# Patient Record
Sex: Female | Born: 1975 | Race: White | Hispanic: No | Marital: Married | Smoking: Never smoker
Health system: Southern US, Community
[De-identification: ages and names within clinical notes are randomized; demographics above are authoritative.]

## PROBLEM LIST (undated history)

## (undated) DIAGNOSIS — E1165 Type 2 diabetes mellitus with hyperglycemia: Secondary | ICD-10-CM

## (undated) DIAGNOSIS — K635 Polyp of colon: Secondary | ICD-10-CM

## (undated) DIAGNOSIS — Z87442 Personal history of urinary calculi: Secondary | ICD-10-CM

## (undated) DIAGNOSIS — K219 Gastro-esophageal reflux disease without esophagitis: Secondary | ICD-10-CM

## (undated) DIAGNOSIS — K449 Diaphragmatic hernia without obstruction or gangrene: Secondary | ICD-10-CM

## (undated) DIAGNOSIS — G932 Benign intracranial hypertension: Secondary | ICD-10-CM

## (undated) DIAGNOSIS — K5790 Diverticulosis of intestine, part unspecified, without perforation or abscess without bleeding: Secondary | ICD-10-CM

## (undated) DIAGNOSIS — K760 Fatty (change of) liver, not elsewhere classified: Secondary | ICD-10-CM

## (undated) DIAGNOSIS — E7800 Pure hypercholesterolemia, unspecified: Secondary | ICD-10-CM

## (undated) DIAGNOSIS — R519 Headache, unspecified: Secondary | ICD-10-CM

## (undated) HISTORY — DX: Diaphragmatic hernia without obstruction or gangrene: K44.9

## (undated) HISTORY — DX: Diverticulosis of intestine, part unspecified, without perforation or abscess without bleeding: K57.90

## (undated) HISTORY — DX: Type 2 diabetes mellitus with hyperglycemia: E11.65

## (undated) HISTORY — PX: COLONOSCOPY WITH ESOPHAGOGASTRODUODENOSCOPY (EGD): SHX5779

## (undated) HISTORY — DX: Polyp of colon: K63.5

## (undated) HISTORY — DX: Pure hypercholesterolemia, unspecified: E78.00

## (undated) HISTORY — PX: COLONOSCOPY: SHX174

## (undated) HISTORY — PX: TONSILLECTOMY AND ADENOIDECTOMY: SHX28

## (undated) HISTORY — DX: Fatty (change of) liver, not elsewhere classified: K76.0

## (undated) HISTORY — DX: Gastro-esophageal reflux disease without esophagitis: K21.9

## (undated) HISTORY — PX: TOTAL ABDOMINAL HYSTERECTOMY: SHX209

## (undated) HISTORY — PX: CYST REMOVAL HAND: SHX6279

## (undated) HISTORY — PX: UPPER GI ENDOSCOPY: SHX6162

## (undated) HISTORY — PX: LITHOTRIPSY: SUR834

## (undated) HISTORY — DX: Benign intracranial hypertension: G93.2

---

## 1999-05-02 DIAGNOSIS — E119 Type 2 diabetes mellitus without complications: Secondary | ICD-10-CM

## 1999-05-02 HISTORY — DX: Type 2 diabetes mellitus without complications: E11.9

## 2014-01-10 DIAGNOSIS — G932 Benign intracranial hypertension: Secondary | ICD-10-CM | POA: Insufficient documentation

## 2016-04-08 DIAGNOSIS — E559 Vitamin D deficiency, unspecified: Secondary | ICD-10-CM

## 2016-04-08 HISTORY — DX: Vitamin D deficiency, unspecified: E55.9

## 2016-04-28 DIAGNOSIS — N200 Calculus of kidney: Secondary | ICD-10-CM | POA: Insufficient documentation

## 2016-04-28 DIAGNOSIS — E559 Vitamin D deficiency, unspecified: Secondary | ICD-10-CM | POA: Insufficient documentation

## 2016-04-28 HISTORY — DX: Calculus of kidney: N20.0

## 2016-11-09 DIAGNOSIS — E785 Hyperlipidemia, unspecified: Secondary | ICD-10-CM | POA: Insufficient documentation

## 2016-11-09 HISTORY — DX: Hyperlipidemia, unspecified: E78.5

## 2017-10-17 HISTORY — DX: Morbid (severe) obesity due to excess calories: E66.01

## 2018-02-13 DIAGNOSIS — M6700 Short Achilles tendon (acquired), unspecified ankle: Secondary | ICD-10-CM | POA: Insufficient documentation

## 2018-02-13 DIAGNOSIS — M722 Plantar fascial fibromatosis: Secondary | ICD-10-CM | POA: Insufficient documentation

## 2018-10-13 ENCOUNTER — Encounter: Payer: Self-pay | Admitting: Nurse Practitioner

## 2019-04-19 DIAGNOSIS — I10 Essential (primary) hypertension: Secondary | ICD-10-CM

## 2019-04-19 HISTORY — DX: Essential (primary) hypertension: I10

## 2019-05-02 DIAGNOSIS — E1165 Type 2 diabetes mellitus with hyperglycemia: Secondary | ICD-10-CM | POA: Insufficient documentation

## 2019-05-02 DIAGNOSIS — E119 Type 2 diabetes mellitus without complications: Secondary | ICD-10-CM | POA: Insufficient documentation

## 2019-11-06 ENCOUNTER — Encounter: Payer: Self-pay | Admitting: Nurse Practitioner

## 2019-12-12 ENCOUNTER — Encounter: Payer: Self-pay | Admitting: Nurse Practitioner

## 2019-12-12 DIAGNOSIS — K760 Fatty (change of) liver, not elsewhere classified: Secondary | ICD-10-CM | POA: Insufficient documentation

## 2020-02-18 ENCOUNTER — Encounter: Payer: Self-pay | Admitting: Nurse Practitioner

## 2020-02-19 ENCOUNTER — Encounter: Payer: Self-pay | Admitting: Nurse Practitioner

## 2020-02-29 ENCOUNTER — Ambulatory Visit (INDEPENDENT_AMBULATORY_CARE_PROVIDER_SITE_OTHER): Payer: Managed Care, Other (non HMO) | Admitting: Nurse Practitioner

## 2020-02-29 ENCOUNTER — Telehealth: Payer: Self-pay | Admitting: Nurse Practitioner

## 2020-02-29 ENCOUNTER — Encounter: Payer: Self-pay | Admitting: Nurse Practitioner

## 2020-02-29 VITALS — BP 106/84 | HR 104 | Temp 97.7°F | Ht 64.25 in | Wt 230.4 lb

## 2020-02-29 DIAGNOSIS — K3184 Gastroparesis: Secondary | ICD-10-CM | POA: Diagnosis not present

## 2020-02-29 DIAGNOSIS — R6889 Other general symptoms and signs: Secondary | ICD-10-CM | POA: Diagnosis not present

## 2020-02-29 DIAGNOSIS — K219 Gastro-esophageal reflux disease without esophagitis: Secondary | ICD-10-CM | POA: Diagnosis not present

## 2020-02-29 DIAGNOSIS — R1013 Epigastric pain: Secondary | ICD-10-CM

## 2020-02-29 DIAGNOSIS — R12 Heartburn: Secondary | ICD-10-CM

## 2020-02-29 DIAGNOSIS — R0989 Other specified symptoms and signs involving the circulatory and respiratory systems: Secondary | ICD-10-CM

## 2020-02-29 NOTE — Progress Notes (Addendum)
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  S/P total abdominal hysterectomy, D & C and C section x 2. She presents today for further evaluation for constant throat clearing, feels like a frog in throat for the past month and epigastric discomfort for the past 6 months. She feels as if food just sits in the top of her stomach. No difficulty swallowing, food does not get stuck in her esophagus. She has occasional heartburn for which she takes TUMS, if not relieved she takes Omeprazole 41m which resolves her symptoms. She feels full easily. No nausea or vomiting. She developed left chest pain 2 weeks ago which occurs randomly unassociated to eating or activity. She is scheduled to see a cardiologist on 03/03/2020 for further evaluation. No chest pain at this time. No SOB. She is passing  A normal brown BM daily or every other day. She occasionally passes a loose stool. No rectal bleeding or black stool. No fever, sweats or chills. She [place]. She stated the EGD showed evidence of gastroparesis and the colonoscopy was normal. I will request a copy of her EGD and colonoscopy reports for further review. No family history of esophageal, gastric or colon cancer.   Abdominal sonogram 12/12/2019:  Hepatic steatosis, non obstructing renal calculi bilaterally.    Past Medical History:  Diagnosis Date  . Benign essential hypertension 04/19/2019   [facility] 02/18/2020  . GERD (gastroesophageal reflux disease)    [facility] [place]  . Hypergly[place] due to type 2 diabetes mellitus ([place]    [facility] [place]  . Hyperlipidemia 11/09/2016   [facility] 02/[facility]  . Kidney stone 04/28/2016   [facility] 02/18/2020  . Morbid obesity ([place] 10/17/2017   [facility]  . Pseudotumor cerebri    [facility] [place]  . Steatosis of liver    [facility] 02/18/2020  . Type 2 diabetes mellitus ([place] 05[place]02/[place]   from encounter [facility] 02/18/2020  . Vitamin D deficiency 04/08/2016   [facility] encounter[facility]/19/2021   Past Surgical History:  [place]ality Date  . [facility] [place]  [place] [facility] [place]  . TOTAL ABDOMINAL HYSTERECTOMY     [facility] 02/18/2020    Social History: Married. She has one son and one daughter. Nonsmoker. No alcohol use. No drug use.   Family History:  Father age 7868Parkinson's disease, HTN an heart disease.  Mother 680age thyroid issues, DM and heart disease.   Allergies  Allergen Reactions  . Sulfa Antibiotics Hives     Outpatient Encounter Medications as of 02/29/2020  Medication Sig  . amLODipine (NORVASC) 10 MG tablet Take 10 mg by mouth daily.  .Marland Kitchenatorvastatin (LIPITOR) 20 MG tablet Take 20 mg by mouth daily.  . hydrochlorothiazide (MICROZIDE) 12.5 MG capsule Take 12.5 mg by mouth daily.  .Marland Kitchenlisinopril (ZESTRIL) 40 MG tablet Take 40 mg by mouth daily.  . metFORMIN (GLUCOPHAGE) 500 MG tablet Take  500 mg by mouth daily.  Marland Kitchen omeprazole (PRILOSEC) 20 MG capsule Take 20 mg by mouth daily.   No facility-administered encounter medications on file as of 02/29/2020.    REVIEW OF SYSTEMS: All other systems reviewed and negative except where noted in the History of Present Illness.  PHYSICAL EXAM: BP 106/84   Pulse (!) 104    Temp 97.7 F (36.5 C)   Ht 5' 4.25" (1.632 m)   Wt 230 lb 6 oz (104.5 kg)   BMI 39.24 kg/m  General: Well developed  44 year old female in no acute distress. Head: Normocephalic and atraumatic. Eyes:  Sclerae non-icteric, conjunctive pink. Ears: Normal auditory acuity. Mouth: Dentition intact. No ulcers or lesions.  Neck: Supple, no lymphadenopathy or thyromegaly.  Lungs: Clear bilaterally to auscultation without wheezes, crackles or rhonchi. Heart: Regular rate and rhythm. No murmur, rub or gallop appreciated.  Abdomen: Soft, nontender, non distended. No masses. No hepatosplenomegaly. Normoactive bowel sounds x 4 quadrants.  Rectal: Deferred.  Musculoskeletal: Symmetrical with no gross deformities. Skin: Warm and dry. No rash or lesions on visible extremities. Extremities: No edema. Neurological: Alert oriented x 4, no focal deficits.  Psychological:  Alert and cooperative. Normal mood and affect.  ASSESSMENT AND PLAN:  32. 44 year old female with frequent throat clearing, heartburn and epigastric discomfort. Questionable history of gastroparesis. EGD benefits and risks discussed including risk with sedation, risk of bleeding, perforation and infection  -Patient will call our office to schedule EGD after cardiac evaluation completed  -Omeprazole 12m once daily -Small  snack size meals 3 to 4 times daily -If EGD is negative, consider gastric empty study   2. Colon cancer screening (Patietn will turn 44 next month)  -Consider colonoscopy at the time of EGD, to discuss further after patient completes cardiac evaluation   3. DM II   4. Hepatic steatosis  -Request copy of most recent CMP and CBC from PCP's office   5. Left chest pain -Proceed with cardiac evaluation   Addendum 03/12/2020: Laboratory studies received from PCP dated 02/18/2020: Alk phos 169.  AST 23.  ALT 23.  Total bili 0.6.  Direct bili< 0.2. Glucose 104.  BUN 12.  Creatinine 0.76.  Sodium 139.  Potassium 4.2.  A  CBC was not done.    Patient will be contacted to have repeat laboratory studies done to include alk phos isoenzymes, GGT, ANA, SMA and AMA and CBC.     CC:  Earp, JPricilla Holm FNP

## 2020-02-29 NOTE — Patient Instructions (Signed)
If you are age 45 or older, your body mass index should be between 23-30. Your Body mass index is 39.24 kg/m. If this is out of the aforementioned range listed, please consider follow up with your Primary Care Provider.  If you are age 83 or younger, your body mass index should be between 19-25. Your Body mass index is 39.24 kg/m. If this is out of the aformentioned range listed, please consider follow up with your Primary Care Provider.   Take omeprazole 20mg  daily Call our office when you have received Cardiac clearance so we may schedule you and Endoscopy.  Due to recent changes in healthcare laws, you may see the results of your imaging and laboratory studies on MyChart before your provider has had a chance to review them.  We understand that in some cases there may be results that are confusing or concerning to you. Not all laboratory results come back in the same time frame and the provider may be waiting for multiple results in order to interpret others.  Please give [place] 48 hours in order for your provider to thoroughly review all the results before contacting the office for clarification of your results.   Thank you for choosing [facility] Noralyn Pick, CRNP

## 2020-02-29 NOTE — Telephone Encounter (Signed)
Tracy, pls contact PCP for copy of most recent CBC and CMP thx

## 2020-03-01 NOTE — Progress Notes (Signed)
Attending Physician's Attestation   I have reviewed the chart.   I agree with the Advanced Practitioner's note, impression, and recommendations with any updates as below.    Nazario Russom Mansouraty, MD [facility] Advanced Endoscopy Office # [phone]  

## 2020-03-12 ENCOUNTER — Telehealth: Payer: Self-pay | Admitting: Nurse Practitioner

## 2020-03-12 DIAGNOSIS — K760 Fatty (change of) liver, not elsewhere classified: Secondary | ICD-10-CM

## 2020-03-12 DIAGNOSIS — R1013 Epigastric pain: Secondary | ICD-10-CM

## 2020-03-12 DIAGNOSIS — R748 Abnormal levels of other serum enzymes: Secondary | ICD-10-CM

## 2020-03-12 NOTE — Telephone Encounter (Signed)
Spoke with the patient she is aware of her elevated alk phosphatase level. The patient was informed we need her to have additional labs, patient agreed to come to the [facility] lab on 03/13/2020. She stated she saw the cardiologist and is scheduled for a stress test on 03/25/2020.

## 2020-03-12 NOTE — Telephone Encounter (Signed)
Olivia Mackie please inform the patient I received a copy of her laboratory studies dated 02/18/2020 by her PCPs office.  Her alk phosphatase level is elevated therefore I have ordered additional labs to follow-up on this.  A CBC was not done therefore I have ordered a CBC as well.  Please enter orders for the following labs:alk phos isoenzymes, GGT, ANA, SMA and AMA and CBC. DX: elevated alk phos, hepatic steatosis and epigastric pain. Please verify with patient if she has completed her cardiac evaluation. Refer to office visit  4/30. Thx.

## 2020-03-13 ENCOUNTER — Other Ambulatory Visit (INDEPENDENT_AMBULATORY_CARE_PROVIDER_SITE_OTHER): Payer: Managed Care, Other (non HMO)

## 2020-03-13 DIAGNOSIS — R1013 Epigastric pain: Secondary | ICD-10-CM | POA: Diagnosis not present

## 2020-03-13 DIAGNOSIS — R748 Abnormal levels of other serum enzymes: Secondary | ICD-10-CM | POA: Diagnosis not present

## 2020-03-13 DIAGNOSIS — K760 Fatty (change of) liver, not elsewhere classified: Secondary | ICD-10-CM | POA: Diagnosis not present

## 2020-03-13 LAB — CBC
HCT: 44.4 % (ref 36.0–46.0)
Hemoglobin: 15.7 g/dL — ABNORMAL HIGH (ref 12.0–15.0)
MCHC: 35.3 g/dL (ref 30.0–36.0)
MCV: 90.2 fl (ref 78.0–100.0)
Platelets: 243 10*3/uL (ref 150.0–400.0)
RBC: 4.93 Mil/uL (ref 3.87–5.11)
RDW: 12.7 % (ref 11.5–15.5)
WBC: 7.7 10*3/uL (ref 4.0–10.5)

## 2020-03-17 LAB — ANA: Anti Nuclear Antibody (ANA): NEGATIVE

## 2020-03-17 LAB — MITOCHONDRIAL ANTIBODIES: Mitochondrial M2 Ab, IgG: <=20 U

## 2020-03-17 LAB — ANTI-SMOOTH MUSCLE ANTIBODY, IGG: Actin (Smooth Muscle) Antibody (IGG): <20 U (ref <20)

## 2020-03-20 LAB — ALKALINE PHOSPHATASE, ISOENZYMES
Alkaline Phosphatase: 166 IU/L — ABNORMAL HIGH (ref 39–117)
BONE FRACTION: 31 % (ref 14–68)
INTESTINAL FRAC.: 14 % (ref 0–18)
LIVER FRACTION: 55 % (ref 18–85)

## 2020-03-25 ENCOUNTER — Telehealth: Payer: Self-pay | Admitting: General Surgery

## 2020-03-25 ENCOUNTER — Other Ambulatory Visit (INDEPENDENT_AMBULATORY_CARE_PROVIDER_SITE_OTHER): Payer: Managed Care, Other (non HMO)

## 2020-03-25 DIAGNOSIS — D582 Other hemoglobinopathies: Secondary | ICD-10-CM

## 2020-03-25 DIAGNOSIS — R748 Abnormal levels of other serum enzymes: Secondary | ICD-10-CM

## 2020-03-25 LAB — IBC + FERRITIN
Ferritin: 76.7 ng/mL (ref 10.0–291.0)
Iron: 109 ug/dL (ref 42–145)
Saturation Ratios: 30.7 % (ref 20.0–50.0)
Transferrin: 254 mg/dL (ref 212.0–360.0)

## 2020-03-25 LAB — GAMMA GT: GGT: 27 U/L (ref 7–51)

## 2020-03-25 NOTE — Telephone Encounter (Signed)
Notified the patient of her results and requested that she comes to the clinic for more labs. The patient stated she will be in [place] today for her stress test and will come to the [place] lab this afternoon.

## 2020-03-25 NOTE — Telephone Encounter (Signed)
-----  Message from Colleen M Kennedy-Smith, NP sent at 03/25/2020  7:05 AM EDT ----- Tracy, can you contact the patient. Her alk phos level remains elevated. Alk phos isoenzymes normal. I requested a GGT level but was not done at the time of this lab collections. Pls send the patient to the lab to have a GGT level done.   Her Hg level is elevated. Please enter iron level and ferritin level to the lab order.  Diagnosis codes: Elevated alk phos and Elevated Hg level for lab order. Thx  

## 2020-03-27 NOTE — Telephone Encounter (Signed)
-----  Message from Noralyn Pick, NP sent at 03/25/2020  7:05 AM EDT ----- Olivia Mackie, can you contact the patient. Her alk phos level remains elevated. Alk phos isoenzymes normal. I requested a GGT level but was not done at the time of this lab collections. Pls send the patient to the lab to have a GGT level done.   Her Hg level is elevated. Please enter iron level and ferritin level to the lab order.  Diagnosis codes: Elevated alk phos and Elevated Hg level for lab order. Thx

## 2020-04-02 ENCOUNTER — Telehealth: Payer: Self-pay | Admitting: Nurse Practitioner

## 2020-04-03 NOTE — Telephone Encounter (Signed)
Pt has been informed to resend as it has not been located.

## 2020-04-04 NOTE — Telephone Encounter (Signed)
Records have been received and given to NP [facility] for review.

## 2020-04-10 ENCOUNTER — Telehealth: Payer: Self-pay

## 2020-04-10 NOTE — Telephone Encounter (Signed)
Patient is advised of her lab results and the recommendations. She is inquiring about EGD and colonoscopy.

## 2020-04-10 NOTE — Telephone Encounter (Signed)
-----  Message from Noralyn Pick, NP sent at 04/07/2020  3:04 PM EDT ----- Eustaquio Maize, can you contact the patient and schedule her for an abd sonogram complete DX: fatty liver and elevated Alk phos level.  Pls provide her with a lab order to repeat a hepatic panel in 4 to 8 weeks.  Thx.

## 2020-04-14 ENCOUNTER — Other Ambulatory Visit: Payer: Self-pay

## 2020-04-14 NOTE — Telephone Encounter (Signed)
Ann Porter, I have reviewed her NM cardiac stress test completed on 03/25/2020 which was normal. ECHO with LV EF 55-60%. Ok to schedule an EGD and a screening colonoscopy. Let me know if the patient has any questions and if she wishes to discuss further. Thx

## 2020-04-14 NOTE — Telephone Encounter (Signed)
Ann Porter The patient was not as interested in the need for abd u/s and repeat labs. She states "I am waiting to hear about the endoscopy and colonoscopy." Did the cardiac information come to you? Can I schedule her?

## 2020-04-15 DIAGNOSIS — H05119 Granuloma of unspecified orbit: Secondary | ICD-10-CM | POA: Insufficient documentation

## 2020-04-15 DIAGNOSIS — K219 Gastro-esophageal reflux disease without esophagitis: Secondary | ICD-10-CM | POA: Insufficient documentation

## 2020-04-15 NOTE — Telephone Encounter (Signed)
Spoke with the patient. Agreed to her Pre-Visit tomorrow. EGD for epigastric pain and a screening colonoscopy on 04/28/20 at 8:00am.

## 2020-04-16 ENCOUNTER — Other Ambulatory Visit: Payer: Self-pay

## 2020-04-16 ENCOUNTER — Ambulatory Visit (AMBULATORY_SURGERY_CENTER): Payer: Self-pay | Admitting: *Deleted

## 2020-04-16 VITALS — Ht 64.25 in | Wt 230.0 lb

## 2020-04-16 DIAGNOSIS — Z1211 Encounter for screening for malignant neoplasm of colon: Secondary | ICD-10-CM

## 2020-04-16 DIAGNOSIS — R1013 Epigastric pain: Secondary | ICD-10-CM

## 2020-04-16 DIAGNOSIS — Z01818 Encounter for other preprocedural examination: Secondary | ICD-10-CM

## 2020-04-16 NOTE — Consult Note (Signed)
Patient is here in-person for PV. Patient denies any allergies to eggs or soy. Patient denies any problems with anesthesia/sedation. Patient denies any oxygen use at home. Patient denies taking any diet/weight loss medications or blood thinners. Patient is not being treated for MRSA or C-diff. Patient is aware of our care-partner policy and PNDLO-31 safety protocol. EMMI education assisgned to the patient for the procedure, this was explained and instructions given to patient. Patient denies any changes to medical hx since last GI OV.  COVID-19 screening test is on 6/23 at 1030 am, the pt is aware.

## 2020-04-23 ENCOUNTER — Ambulatory Visit (INDEPENDENT_AMBULATORY_CARE_PROVIDER_SITE_OTHER): Payer: Managed Care, Other (non HMO)

## 2020-04-23 ENCOUNTER — Other Ambulatory Visit: Payer: Self-pay | Admitting: Gastroenterology

## 2020-04-23 DIAGNOSIS — Z1159 Encounter for screening for other viral diseases: Secondary | ICD-10-CM

## 2020-04-24 LAB — SARS CORONAVIRUS 2 (TAT 6-24 HRS): SARS Coronavirus 2: NEGATIVE

## 2020-04-25 ENCOUNTER — Encounter: Payer: Self-pay | Admitting: Gastroenterology

## 2020-04-25 ENCOUNTER — Telehealth: Payer: Self-pay | Admitting: Nurse Practitioner

## 2020-04-25 NOTE — Telephone Encounter (Signed)
Added to Dr Vena Rua Monday schedule on 04-28-2020 at 10:30am.

## 2020-04-25 NOTE — Telephone Encounter (Signed)
OPENED IN ERROR

## 2020-04-25 NOTE — Telephone Encounter (Signed)
Noted thanks °

## 2020-04-25 NOTE — Telephone Encounter (Signed)
Patient was called this morning to r/s procedure with Dr. Rush Landmark on monday. there is no more appointments to schedule with Dr. Rush Landmark because it is a double procedure and he has nothing avaiable. and she is not happy. She is asking to speak with nurse to see what she can do.

## 2020-04-28 ENCOUNTER — Ambulatory Visit (AMBULATORY_SURGERY_CENTER): Payer: Managed Care, Other (non HMO) | Admitting: Internal Medicine

## 2020-04-28 ENCOUNTER — Encounter: Payer: Managed Care, Other (non HMO) | Admitting: Gastroenterology

## 2020-04-28 ENCOUNTER — Other Ambulatory Visit: Payer: Self-pay

## 2020-04-28 ENCOUNTER — Encounter: Payer: Self-pay | Admitting: Internal Medicine

## 2020-04-28 VITALS — BP 126/80 | HR 79 | Temp 97.3°F | Resp 15 | Ht 64.25 in | Wt 230.0 lb

## 2020-04-28 DIAGNOSIS — K319 Disease of stomach and duodenum, unspecified: Secondary | ICD-10-CM

## 2020-04-28 DIAGNOSIS — R12 Heartburn: Secondary | ICD-10-CM

## 2020-04-28 DIAGNOSIS — K209 Esophagitis, unspecified without bleeding: Secondary | ICD-10-CM

## 2020-04-28 DIAGNOSIS — D125 Benign neoplasm of sigmoid colon: Secondary | ICD-10-CM

## 2020-04-28 DIAGNOSIS — K2970 Gastritis, unspecified, without bleeding: Secondary | ICD-10-CM

## 2020-04-28 DIAGNOSIS — K635 Polyp of colon: Secondary | ICD-10-CM | POA: Diagnosis not present

## 2020-04-28 DIAGNOSIS — R1013 Epigastric pain: Secondary | ICD-10-CM

## 2020-04-28 DIAGNOSIS — K621 Rectal polyp: Secondary | ICD-10-CM | POA: Diagnosis not present

## 2020-04-28 DIAGNOSIS — D122 Benign neoplasm of ascending colon: Secondary | ICD-10-CM

## 2020-04-28 DIAGNOSIS — Z1211 Encounter for screening for malignant neoplasm of colon: Secondary | ICD-10-CM | POA: Diagnosis present

## 2020-04-28 DIAGNOSIS — D128 Benign neoplasm of rectum: Secondary | ICD-10-CM

## 2020-04-28 MED ORDER — SODIUM CHLORIDE 0.9 % IV SOLN: 500.0000 mL | Freq: Once | INTRAVENOUS | Status: DC

## 2020-04-28 MED ORDER — PANTOPRAZOLE SODIUM 40 MG PO TBEC
40.0000 mg | DELAYED_RELEASE_TABLET | Freq: Two times a day (BID) | ORAL | 3 refills | Status: DC
Start: 2020-04-28 — End: 2021-06-18

## 2020-04-28 NOTE — Progress Notes (Signed)
Esmolol 10mg  IV given for cotinued tachycardia as per Dr. Hilarie Fredrickson. May repeat as needed.

## 2020-04-28 NOTE — Op Note (Signed)
[facility] Patient Name: Ann Porter Procedure Date: 04/28/2020 10:35 AM MRN: [id number] Endoscopist: Jerene Bears , MD Age: 44 Referring MD:  Date of Birth: Feb 13, 1976 Gender: Female Account #: [id number] Procedure:                Colonoscopy Indications:              Screening for colorectal malignant neoplasm, This                            is the patient's first colonoscopy Medicines:                Monitored Anesthesia Care Procedure:                Pre-Anesthesia Assessment:                           - Prior to the procedure, a History and Physical                            was performed, and patient medications and                            allergies were reviewed. The patient's tolerance of                            previous anesthesia was also reviewed. The risks                            and benefits of the procedure and the sedation                            options and risks were discussed with the patient.                            All questions were answered, and informed consent                            was obtained. Prior Anticoagulants: The patient has                            taken no previous anticoagulant or antiplatelet                            agents. ASA Grade Assessment: III - A patient with                            severe systemic disease. After reviewing the risks                            and benefits, the patient was deemed in                            satisfactory condition to undergo the procedure.  After obtaining informed consent, the colonoscope                            was passed under direct vision. Throughout the                            procedure, the patient's blood pressure, pulse, and                            oxygen saturations were monitored continuously. The                            Colonoscope was introduced through the anus and                            advanced to the cecum,  identified by appendiceal                            orifice and ileocecal valve. The colonoscopy was                            performed without difficulty. The patient tolerated                            the procedure well. The quality of the bowel                            preparation was good. The ileocecal valve,                            appendiceal orifice, and rectum were photographed. Scope In: 10:53:25 AM Scope Out: 11:11:04 AM Scope Withdrawal Time: 0 hours 14 minutes 49 seconds  Total Procedure Duration: 0 hours 17 minutes 39 seconds  Findings:                 The digital rectal exam was normal.                           Two sessile polyps were found in the ascending                            colon. The polyps were 5 to 6 mm in size. These                            polyps were removed with a cold snare. Resection                            and retrieval were complete.                           A 6 mm polyp was found in the sigmoid colon. The                            polyp was sessile.  The polyp was removed with a                            cold snare. Resection and retrieval were complete.                           A 3 mm polyp was found in the rectum. The polyp was                            sessile. The polyp was removed with a cold snare.                            Resection and retrieval were complete.                           Multiple small-mouthed diverticula were found in                            the sigmoid colon and distal descending colon.                           The retroflexed view of the distal rectum and anal                            verge was normal and showed no anal or rectal                            abnormalities. Complications:            No immediate complications. Estimated Blood Loss:     Estimated blood loss was minimal. Impression:               - Two 5 to 6 mm polyps in the ascending colon,                            removed with a  cold snare. Resected and retrieved.                           - One 6 mm polyp in the sigmoid colon, removed with                            a cold snare. Resected and retrieved.                           - One 3 mm polyp in the rectum, removed with a cold                            snare. Resected and retrieved.                           - Diverticulosis in the sigmoid colon and in the  distal descending colon.                           - The distal rectum and anal verge are normal on                            retroflexion view. Recommendation:           - Patient has a contact number available for                            emergencies. The signs and symptoms of potential                            delayed complications were discussed with the                            patient. Return to normal activities tomorrow.                            Written discharge instructions were provided to the                            patient.                           - Resume previous diet.                           - Continue present medications.                           - Await pathology results.                           - Repeat colonoscopy is recommended for                            surveillance. The colonoscopy date will be                            determined after pathology results from today's                            exam become available for review. Jerene Bears, MD 04/28/2020 11:20:53 AM This report has been signed electronically.

## 2020-04-28 NOTE — Patient Instructions (Signed)
Handouts given:  Hiatal Hernia,Gastritis, Esophagitis, Polyps, Diverticulosis Resume previous diet Continue present medications Await pathology results Start pantoprazole 40mg  twice daily for 8 weeks then reduce to once daily Please follow up in office in 8-12 weeks to ensure improvement.  YOU HAD AN ENDOSCOPIC PROCEDURE TODAY AT [facility]:   Refer to the procedure report that was given to you for any specific questions about what was found during the examination.  If the procedure report does not answer your questions, please call your gastroenterologist to clarify.  If you requested that your care partner not be given the details of your procedure findings, then the procedure report has been included in a sealed envelope for you to review at your convenience later.  YOU SHOULD EXPECT: Some feelings of bloating in the abdomen. Passage of more gas than usual.  Walking can help get rid of the air that was put into your GI tract during the procedure and reduce the bloating. If you had a lower endoscopy (such as a colonoscopy or flexible sigmoidoscopy) you may notice spotting of blood in your stool or on the toilet paper. If you underwent a bowel prep for your procedure, you may not have a normal bowel movement for a few days.  Please Note:  You might notice some irritation and congestion in your nose or some drainage.  This is from the oxygen used during your procedure.  There is no need for concern and it should clear up in a day or so.  SYMPTOMS TO REPORT IMMEDIATELY:   Following lower endoscopy (colonoscopy or flexible sigmoidoscopy):  Excessive amounts of blood in the stool  Significant tenderness or worsening of abdominal pains  Swelling of the abdomen that is new, acute  Fever of 100F or higher   Following upper endoscopy (EGD)  Vomiting of blood or coffee ground material  New chest pain or pain under the shoulder blades  Painful or persistently difficult  swallowing  New shortness of breath  Fever of 100F or higher  Black, tarry-looking stools  For urgent or emergent issues, a gastroenterologist can be reached at any hour by calling [phone]. Do not use MyChart messaging for urgent concerns.    DIET:  We do recommend a small meal at first, but then you may proceed to your regular diet.  Drink plenty of fluids but you should avoid alcoholic beverages for 24 hours.  ACTIVITY:  You should plan to take it easy for the rest of today and you should NOT DRIVE or use heavy machinery until tomorrow (because of the sedation medicines used during the test).    FOLLOW UP: Our staff will call the number listed on your records 48-72 hours following your procedure to check on you and address any questions or concerns that you may have regarding the information given to you following your procedure. If we do not reach you, we will leave a message.  We will attempt to reach you two times.  During this call, we will ask if you have developed any symptoms of COVID 19. If you develop any symptoms (ie: fever, flu-like symptoms, shortness of breath, cough etc.) before then, please call [phone].  If you test positive for Covid 19 in the 2 weeks post procedure, please call and report this information to [place].    If any biopsies were taken you will be contacted by phone or by letter within the next 1-3 weeks.  Please call us at [phone] if you have  not heard about the biopsies in 3 weeks.    SIGNATURES/CONFIDENTIALITY: You and/or your care partner have signed paperwork which will be entered into your electronic medical record.  These signatures attest to the fact that that the information above on your After Visit Summary has been reviewed and is understood.  Full responsibility of the confidentiality of this discharge information lies with you and/or your care-partner.

## 2020-04-28 NOTE — Progress Notes (Signed)
pt tolerated well. VSS. awake and to recovery. Report given to RN. Bite block placed with eases and removed without trauma.

## 2020-04-28 NOTE — Op Note (Signed)
[place] Patient Name: Ann Porter Procedure Date: 04/28/2020 10:35 AM MRN: [id number] Endoscopist: Jerene Bears , MD Age: 44 Referring MD:  Date of Birth: [birth year] Gender: Female Account #: [id number] Procedure:                Upper GI endoscopy Indications:              Epigastric abdominal pain, Heartburn Medicines:                Monitored Anesthesia Care Procedure:                Pre-Anesthesia Assessment:                           - Prior to the procedure, a History and Physical                            was performed, and patient medications and                            allergies were reviewed. The patient's tolerance of                            previous anesthesia was also reviewed. The risks                            and benefits of the procedure and the sedation                            options and risks were discussed with the patient.                            All questions were answered, and informed consent                            was obtained. Prior Anticoagulants: The patient has                            taken no previous anticoagulant or antiplatelet                            agents. ASA Grade Assessment: III - A patient with                            severe systemic disease. After reviewing the risks                            and benefits, the patient was deemed in                            satisfactory condition to undergo the procedure.                           After obtaining informed consent, the endoscope was  passed under direct vision. Throughout the                            procedure, the patient's blood pressure, pulse, and                            oxygen saturations were monitored continuously. The                            Endoscope was introduced through the mouth, and                            advanced to the second part of duodenum. The upper                            GI endoscopy was  accomplished without difficulty.                            The patient tolerated the procedure well. Scope In: Scope Out: Findings:                 LA Grade B (one or more mucosal breaks greater than                            5 mm, not extending between the tops of two mucosal                            folds) esophagitis with no bleeding was found at                            the gastroesophageal junction. Biopsies were taken                            with a cold forceps for histology.                           A 3 cm hiatal hernia was present.                           The gastroesophageal flap valve was visualized                            endoscopically and classified as Hill Grade IV (no                            fold, wide open lumen, hiatal hernia present).                           Mild inflammation characterized by congestion                            (edema) and erythema was found in the gastric  antrum. Biopsies were taken with a cold forceps for                            histology and Helicobacter pylori testing.                           The examined duodenum was normal. Complications:            No immediate complications. Estimated Blood Loss:     Estimated blood loss was minimal. Impression:               - Reflux esophagitis with no bleeding. Biopsied.                           - 3 cm hiatal hernia.                           - Gastritis. Biopsied.                           - Normal examined duodenum. Recommendation:           - Patient has a contact number available for                            emergencies. The signs and symptoms of potential                            delayed complications were discussed with the                            patient. Return to normal activities tomorrow.                            Written discharge instructions were provided to the                            patient.                           - Resume  previous diet.                           - Continue present medications.                           - Begin pantoprazole 40 mg twice daily (30 min                            before your 1st and last meal of the day). Would                            continue twice daily therapy for 8 weeks to allow                            for healing of esophagitis, then reduce to once  daily.                           - Await pathology results.                           - Office follow-up in 8-12 weeks to ensure                            improvement. Jerene Bears, MD 04/28/2020 11:18:29 AM This report has been signed electronically.

## 2020-04-28 NOTE — Progress Notes (Signed)
VS by VV   Pt's states no medical or surgical changes since previsit or office visit.

## 2020-04-30 ENCOUNTER — Telehealth: Payer: Self-pay | Admitting: *Deleted

## 2020-04-30 NOTE — Telephone Encounter (Signed)
Attempted f/u phone call. No answer. Left message. °

## 2020-04-30 NOTE — Telephone Encounter (Signed)
  Follow up Call-  Call back number 04/28/2020  Post procedure Call Back phone  # [phone]  Permission to leave phone message Yes     Patient questions:  Do you have a fever, pain , or abdominal swelling? No. Pain Score  0 *  Have you tolerated food without any problems? Yes.    Have you been able to return to your normal activities? Yes.    Do you have any questions about your discharge instructions: Diet   No. Medications  No. Follow up visit  No.  Do you have questions or concerns about your Care? No.  Actions: * If pain score is 4 or above: No action needed, pain <4.  1. Have you developed a fever since your procedure? no  2.   Have you had an respiratory symptoms (SOB or cough) since your procedure? no  3.   Have you tested positive for COVID 19 since your procedure no  4.   Have you had any family members/close contacts diagnosed with the COVID 19 since your procedure?  no   If yes to any of these questions please route to Joylene John, RN and Erenest Rasher, RN

## 2020-05-06 ENCOUNTER — Encounter: Payer: Self-pay | Admitting: Internal Medicine

## 2020-06-09 ENCOUNTER — Encounter: Payer: Self-pay | Admitting: *Deleted

## 2020-06-30 NOTE — Progress Notes (Signed)
[facility]    Provider:  Dr Jaynee Eagles Requesting Provider: Harvie Bridge, MD Primary Care Provider:  Clinton Quant, MD  CC:  iih  HPI:  Ann Porter is a 44 y.o. female here as requested by Harvie Bridge, MD for benign intracranial hypertension. I reviewed Dr. Milta Deiters notes: She has a PMHx of IDIOPATHIC INTRACRANIAL HYPERTENSION, HLD, HTN, kidney stone, DM2, papilledema. OD 20/30-1. OS 20/40, PERRL, no APD, anterior exam unremarkable, posterior exam withj PPA mild elevation of the disks. Patient with bilateral eye pain, pressure, she was evaluated in the past at [facility] [place] and treated at neurology, exam showed mild prominence of the optic disks OU without evidence of SVP. OCT ONH reveals mild elevation surrounding each optic nerve,. She was referred to neurology and neuro-ophthalmology.   She was diagnosed initially 10 years ago, she is going to [facility] October 18th neuro-opthalmology, she has been having blurry vision and pressure behind the eyes like she can press her eyes back in her head. Also in the back of the head but strecthing out the neck feels great like int he salon. She is having bilateral temple and directly behind the eye pressure, daily for the last week, just started a few weeks, she has it 10-15 years ago but hasn't taken medication in years. She was on diamox, she could not tolerate topamax. Visionon exam was good. Worse during the day and evening. Sleep helps. The headaches slowly progres all day until severe. Neurologist in the past Dr. Alesia Richards in [place]. She has had 2 lumbar punctures, she endedup in the ER and needed blood patc. No weight gain, no IUD(hysterectomy), no Vitamin A, no long term use of doxycycline, she has been tested multiple times and negative.   Reviewed notes, labs and imaging from outside physicians, which showed: see above  Review of Systems: Patient complains of symptoms per HPI as well as the  following symptoms: eye pain. Pertinent negatives and positives per HPI. All others negative.   Social History   Socioeconomic History  . Marital status: Married    Spouse name: Not on file  . Number of children: 2  . Years of education: Not on file  . Highest education level: Not on file  Occupational History  . Not on file  Tobacco Use  . Smoking status: Never Smoker  . Smokeless tobacco: Never Used  Vaping Use  . Vaping Use: Never used  Substance and Sexual Activity  . Alcohol use: Not Currently  . Drug use: Not Currently  . Sexual activity: Not on file  Other Topics Concern  . Not on file  Social History Narrative   Lives at home spouse and 2 children   Right handed   Caffeine: maybe 1 cup/day   Social Determinants of Health   Financial Resource Strain:   . Difficulty of Paying Living Expenses: Not on file  Food Insecurity:   . Worried About Charity fundraiser in the Last Year: Not on file  . Ran Out of Food in the Last Year: Not on file  Transportation Needs:   . Lack of Transportation (Medical): Not on file  . Lack of Transportation (Non-Medical): Not on file  Physical Activity:   . Days of Exercise per Week: Not on file  . Minutes of Exercise per Session: Not on file  Stress:   . Feeling of Stress : Not on file  Social Connections:   . Frequency of Communication with Friends and Family:  Not on file  . Frequency of Social Gatherings with Friends and Family: Not on file  . Attends Religious Services: Not on file  . Active Member of Clubs or Organizations: Not on file  . Attends Archivist Meetings: Not on file  . Marital Status: Not on file  Intimate Partner Violence:   . Fear of Current or Ex-Partner: Not on file  . Emotionally Abused: Not on file  . Physically Abused: Not on file  . Sexually Abused: Not on file    Family History  Problem Relation Age of Onset  . Heart disease Mother   . Gout Mother   . Hypertension Mother   . Elevated  Lipids Mother   . Alcohol abuse Father   . Heart disease Father   . Diabetes Father   . Parkinson's disease Father   . Hyperlipidemia Father   . Hypertension Father   . Sleep apnea Father   . Gout Father   . Strabismus Other   . Macular degeneration Other   . Colon cancer Neg Hx   . Liver cancer Neg Hx   . Esophageal cancer Neg Hx   . Rectal cancer Neg Hx   . Stomach cancer Neg Hx   . Colon polyps Neg Hx   . Pseudotumor cerebri Neg Hx     Past Medical History:  Diagnosis Date  . Benign essential hypertension 04/19/2019   [place] [facility] 02/18/2020  . Diverticulosis   . GERD (gastroesophageal reflux disease)    [place] [facility] 02/18/2020  . Hiatal hernia   . Hypercholesterolemia    [place] 06/25/20  . Hyperglycemia due to type 2 diabetes mellitus ([place])    [place] [facility] 02/18/2020  . Hyperlipidemia 11/09/2016   [place] [facility] 02/18/2020  . Hyperplastic colon polyp   . Kidney stone 04/28/2016   [facility] [facility] 02/18/2020  . Morbid obesity ([place]) 10/17/2017   [place] [facility] 02/18/2020  . Pseudotumor cerebri    [place] [facility] 02/18/2020  . Steatosis of liver    [facility] 02/18/2020  . Type 2 diabetes mellitus ([place]) 05/02/1999   from encounter [facility] 02/18/2020  . Vitamin D deficiency 04/08/2016   [facility] encounter4/19/2021    Patient Active Problem List   Diagnosis Date Noted  . Gastroesophageal reflux disease 04/15/2020  . 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 Throat clearing 02/29/2020  . Abdominal pain, epigastric 02/29/2020  . Heartburn 02/29/2020  . Steatosis of liver 12/12/2019  . Hyperglycemia due to type 2 diabetes mellitus ([place]) 05/02/2019  . Type 2 diabetes mellitus without complication ([place]) 15/40/0867  . Benign essential hypertension 04/19/2019  . Plantar fasciitis of left foot 02/13/2018  .  Short Achilles tendon 02/13/2018  . Morbid obesity ([place]) 10/17/2017  . Hyperlipidemia 11/09/2016  . Kidney stone 04/28/2016  . Vitamin D deficiency 04/28/2016  . IIH (idiopathic intracranial hypertension) 01/10/2014    Past Surgical History:  Procedure Laterality Date  . [facility] 02/18/2020  . COLONOSCOPY  10-15 years ago    in [place] ?polyp  . COLONOSCOPY WITH ESOPHAGOGASTRODUODENOSCOPY (EGD)    . CYST REMOVAL HAND    . LITHOTRIPSY     x 2  . [place] [facility] 02/18/2020  . [facility] [facility] 02/18/2020    Current Outpatient Medications  Medication Sig Dispense Refill  .  atorvastatin (LIPITOR) 20 MG tablet Take 20 mg by mouth daily.     Marland Kitchen lisinopril (ZESTRIL) 40 MG tablet Take 40 mg by mouth daily.    . metFORMIN (GLUCOPHAGE) 500 MG tablet Take 500 mg by mouth daily.    . pantoprazole (PROTONIX) 40 MG tablet Take 1 tablet (40 mg total) by mouth 2 (two) times daily. Please take 30 min before 1st and last meal of the day. 90 tablet 3  . acetaZOLAMIDE (DIAMOX) 250 MG tablet Take 1 tablet (250 mg total) by mouth 2 (two) times daily. 60 tablet 6   No current facility-administered medications for this visit.    Allergies as of 07/01/2020 - Review Complete 07/01/2020  Allergen Reaction Noted  . Sulfa antibiotics Hives 01/10/2014    Vitals: BP (!) 141/93 (BP Location: Right Arm, Patient Position: Sitting, Cuff Size: Large)   Pulse 81   Ht 5\' 4"  (1.626 m)   Wt 233 lb (105.7 kg)   BMI 39.99 kg/m  Last Weight:  Wt Readings from Last 1 Encounters:  07/01/20 233 lb (105.7 kg)   Last Height:   Ht Readings from Last 1 Encounters:  07/01/20 5\' 4"  (1.626 m)     Physical exam: Exam: Gen: NAD, conversant, well nourised, obese, well groomed                     CV: RRR, no MRG. No Carotid Bruits. No peripheral edema, warm, nontender Eyes: Conjunctivae  clear without exudates or hemorrhage  Neuro: Detailed Neurologic Exam  Speech:    Speech is normal; fluent and spontaneous with normal comprehension.  Cognition:    The patient is oriented to person, place, and time;     recent and remote memory intact;     language fluent;     normal attention, concentration,     fund of knowledge Cranial Nerves:    The pupils are equal, round, and reactive to light. Blurring and elevation of the ONH. Visual fields are full to finger confrontation. Extraocular movements are intact. Trigeminal sensation is intact and the muscles of mastication are normal. The face is symmetric. The palate elevates in the midline. Hearing intact. Voice is normal. Shoulder shrug is normal. The tongue has normal motion without fasciculations.   Coordination:    Normal finger to nose and heel to shin. Normal rapid alternating movements.   Gait:    Heel-toe and tandem gait are normal.   Motor Observation:    No asymmetry, no atrophy, and no involuntary movements noted. Tone:    Normal muscle tone.    Posture:    Posture is normal. normal erect    Strength:    Strength is V/V in the upper and lower limbs.      Sensation: intact to LT     Reflex Exam:  DTR's:    Deep tendon reflexes in the upper and lower extremities are normal bilaterally.   Toes:    The toes are downgoing bilaterally.   Clonus:    Clonus is absent.    Assessment/Plan:  Patient with a history of IDIOPATHIC INTRACRANIAL HYPERTENSION now with recurrent symptoms. However needs thorough eval for other causes of elevated intracranial pressure.  MRI of the brain w/wo contrast: MRI brain due to concerning symptoms of nocturnal headaches, positional headaches,vision changes, papilledema  to look for space occupying or compressive mass, chiari or othe causes of intracranial hypertension (pseudotumor).  CTV to evaluate for thrombosis or stenosis(which can be stented) Lumbar puncture  for opening  pressure Took acetazolamide in the past never had problems despite sulfa allergy. Will hold on to prescription until work up complete.  Orders Placed This Encounter  Procedures  . MR BRAIN W WO CONTRAST  . CT VENOGRAM HEAD  . DG FLUORO GUIDED LOC OF NEEDLE/CATH TIP FOR SPINAL INJECT RT  . CBC  . Comprehensive metabolic panel  . TSH   Meds ordered this encounter  Medications  . acetaZOLAMIDE (DIAMOX) 250 MG tablet    Sig: Take 1 tablet (250 mg total) by mouth 2 (two) times daily.    Dispense:  60 tablet    Refill:  6     Weight loss is critical, discussed weight loss and risk of permanent vision loss  For any acute change especially worsening headache or vision loss call 911 and proceed to ED  To prevent or relieve headaches, try the following: . Cool Compress. Lie down and place a cool compress on your head.  . Avoid headache triggers. If certain foods or odors seem to have triggered your migraines in the past, avoid them. A headache diary might help you identify triggers.  . Include physical activity in your daily routine. Try a daily walk or other moderate aerobic exercise.  . Manage stress. Find healthy ways to cope with the stressors, such as delegating tasks on your to-do list.  . Practice relaxation techniques. Try deep breathing, yoga, massage and visualization.  . Eat regularly. Eating regularly scheduled meals and maintaining a healthy diet might help prevent headaches. Also, drink plenty of fluids.  . Follow a regular sleep schedule. Sleep deprivation might contribute to headaches . Consider biofeedback. With this mind-body technique, you learn to control certain bodily functions -- such as muscle tension, heart rate and blood pressure -- to prevent headaches or reduce headache pain.    Proceed to emergency room if you experience new or worsening symptoms or symptoms do not resolve, if you have new neurologic symptoms or if headache is severe, or for any concerning  symptom.   Provided education and documentation from American headache Society toolbox including articles on: pseudotumoer cerebri(IIH), chronic migraine medication overuse headache, chronic migraines, prevention of migraines and other headaches, behavioral and other nonpharmacologic treatments for headache.    Cc: Harvie Bridge, MD,  [place], Cherly Anderson, MD  Sarina Ill, MD  [facility] [address] [place], [place] [address]  Phone [phone] Fax [phone]

## 2020-07-01 ENCOUNTER — Encounter: Payer: Self-pay | Admitting: Neurology

## 2020-07-01 ENCOUNTER — Ambulatory Visit (INDEPENDENT_AMBULATORY_CARE_PROVIDER_SITE_OTHER): Payer: Managed Care, Other (non HMO) | Admitting: Neurology

## 2020-07-01 ENCOUNTER — Telehealth: Payer: Self-pay | Admitting: Neurology

## 2020-07-01 VITALS — BP 141/93 | HR 81 | Ht 64.0 in | Wt 233.0 lb

## 2020-07-01 DIAGNOSIS — H4710 Unspecified papilledema: Secondary | ICD-10-CM

## 2020-07-01 DIAGNOSIS — R51 Headache with orthostatic component, not elsewhere classified: Secondary | ICD-10-CM | POA: Diagnosis not present

## 2020-07-01 DIAGNOSIS — G441 Vascular headache, not elsewhere classified: Secondary | ICD-10-CM | POA: Diagnosis not present

## 2020-07-01 DIAGNOSIS — G932 Benign intracranial hypertension: Secondary | ICD-10-CM | POA: Diagnosis not present

## 2020-07-01 DIAGNOSIS — H547 Unspecified visual loss: Secondary | ICD-10-CM

## 2020-07-01 DIAGNOSIS — I8290 Acute embolism and thrombosis of unspecified vein: Secondary | ICD-10-CM

## 2020-07-01 MED ORDER — ACETAZOLAMIDE 250 MG PO TABS: 250.0000 mg | ORAL_TABLET | Freq: Two times a day (BID) | ORAL | 6 refills | Status: DC

## 2020-07-01 NOTE — Patient Instructions (Signed)
MRI of the brain w/wo contrast.  CTVenogram Lumbar puncture Took acetazolamide in the past never had problems despite sulfa allergy - hold on to it until we call Blood work today 10 weeks with Dr Jaynee Eagles   Idiopathic Intracranial Hypertension  Idiopathic intracranial hypertension (IIH) is a condition that increases pressure around the brain. The fluid that surrounds the brain and spinal cord (cerebrospinal fluid, CSF) increases and causes the pressure. Idiopathic means that the cause of this condition is not known. IIH affects the brain and spinal cord (is a neurological disorder). If this condition is not treated, it can cause vision loss or blindness. What increases the risk? You are more likely to develop this condition if:  You are severely overweight (obese).  You are a woman who has not gone through menopause.  You take certain medicines, such as birth control or steroids. What are the signs or symptoms? Symptoms of IIH include:  Headaches. This is the most common symptom.  Pain in the shoulders or neck.  Nausea and vomiting.  A "rushing water" or pulsing sound within the ears (pulsatile tinnitus).  Double vision.  Blurred vision.  Brief episodes of complete vision loss. How is this diagnosed? This condition may be diagnosed based on:  Your symptoms.  Your medical history.  CT scan of the brain.  MRI of the brain.  Magnetic resonance venogram (MRV) to check veins in the brain.  Diagnostic lumbar puncture. This is a procedure to remove and examine a sample of cerebrospinal fluid. This procedure can determine whether too much fluid may be causing IIH.  A thorough eye exam to check for swelling or nerve damage in the eyes. How is this treated? Treatment for this condition depends on your symptoms. The goal of treatment is to decrease the pressure around your brain. Common treatments include:  Medicines to decrease the production of spinal fluid and lower the  pressure within your skull.  Medicines to prevent or treat headaches.  Surgery to place drains (shunts) in your brain to remove excess fluid.  Lumbar puncture to remove excess cerebrospinal fluid. Follow these instructions at home:  If you are overweight or obese, work with your health care provider to lose weight.  Take over-the-counter and prescription medicines only as told by your health care provider.  Do not drive or use heavy machinery while taking medicines that can make you sleepy.  Keep all follow-up visits as told by your health care provider. This is important. Contact a health care provider if:  You have changes in your vision, such as: ? Double vision. ? Not being able to see colors (color vision). Get help right away if:  You have any of the following symptoms and they get worse or do not get better. ? Headaches. ? Nausea. ? Vomiting. ? Vision changes or difficulty seeing. Summary  Idiopathic intracranial hypertension (IIH) is a condition that increases pressure around the brain. The cause is not known (is idiopathic).  The most common symptom of IIH is headaches.  Treatment may include medicines or surgery to relieve the pressure on your brain. This information is not intended to replace advice given to you by your health care provider. Make sure you discuss any questions you have with your health care provider. Document Revised: 09/30/2017 Document Reviewed: 09/08/2016 Elsevier Patient Education  2020 [employer].

## 2020-07-01 NOTE — Telephone Encounter (Signed)
[employer] Auth: [id number] (exp. 07/01/20 to 07/30/20) patient is scheduled for her MRI at GI for 07/22/20. This Josem Kaufmann is good for both the MRI and CT.

## 2020-07-02 ENCOUNTER — Ambulatory Visit (INDEPENDENT_AMBULATORY_CARE_PROVIDER_SITE_OTHER): Payer: Managed Care, Other (non HMO) | Admitting: Internal Medicine

## 2020-07-02 ENCOUNTER — Encounter: Payer: Self-pay | Admitting: Internal Medicine

## 2020-07-02 VITALS — BP 144/84 | HR 84 | Ht 64.0 in | Wt 231.0 lb

## 2020-07-02 DIAGNOSIS — K449 Diaphragmatic hernia without obstruction or gangrene: Secondary | ICD-10-CM

## 2020-07-02 DIAGNOSIS — R1013 Epigastric pain: Secondary | ICD-10-CM

## 2020-07-02 DIAGNOSIS — Z01818 Encounter for other preprocedural examination: Secondary | ICD-10-CM | POA: Diagnosis not present

## 2020-07-02 DIAGNOSIS — R1319 Other dysphagia: Secondary | ICD-10-CM

## 2020-07-02 DIAGNOSIS — K2100 Gastro-esophageal reflux disease with esophagitis, without bleeding: Secondary | ICD-10-CM

## 2020-07-02 DIAGNOSIS — R1310 Dysphagia, unspecified: Secondary | ICD-10-CM

## 2020-07-02 LAB — COMPREHENSIVE METABOLIC PANEL
ALT: 29 IU/L (ref 0–32)
AST: 22 IU/L (ref 0–40)
Albumin/Globulin Ratio: 2.1 (ref 1.2–2.2)
Albumin: 4.4 g/dL (ref 3.8–4.8)
Alkaline Phosphatase: 156 IU/L — ABNORMAL HIGH (ref 48–121)
BUN/Creatinine Ratio: 17 (ref 9–23)
BUN: 11 mg/dL (ref 6–24)
Bilirubin Total: 0.4 mg/dL (ref 0.0–1.2)
CO2: 22 mmol/L (ref 20–29)
Calcium: 9.7 mg/dL (ref 8.7–10.2)
Chloride: 101 mmol/L (ref 96–106)
Creatinine, Ser: 0.63 mg/dL (ref 0.57–1.00)
GFR calc Af Amer: 125 mL/min/{1.73_m2} (ref >59)
GFR calc non Af Amer: 109 mL/min/{1.73_m2} (ref >59)
Globulin, Total: 2.1 g/dL (ref 1.5–4.5)
Glucose: 183 mg/dL — ABNORMAL HIGH (ref 65–99)
Potassium: 4.1 mmol/L (ref 3.5–5.2)
Sodium: 138 mmol/L (ref 134–144)
Total Protein: 6.5 g/dL (ref 6.0–8.5)

## 2020-07-02 LAB — TSH: TSH: 2.56 u[IU]/mL (ref 0.450–4.500)

## 2020-07-02 LAB — CBC
Hematocrit: 46.4 % (ref 34.0–46.6)
Hemoglobin: 15.5 g/dL (ref 11.1–15.9)
MCH: 30.7 pg (ref 26.6–33.0)
MCHC: 33.4 g/dL (ref 31.5–35.7)
MCV: 92 fL (ref 79–97)
Platelets: 261 10*3/uL (ref 150–450)
RBC: 5.05 x10E6/uL (ref 3.77–5.28)
RDW: 12.4 % (ref 11.7–15.4)
WBC: 7.4 10*3/uL (ref 3.4–10.8)

## 2020-07-02 NOTE — Patient Instructions (Signed)
You have been scheduled for an endoscopy. Please follow written instructions given to you at your visit today. If you use inhalers (even only as needed), please bring them with you on the day of your procedure.  Continue pantoprazole 40 mg twice daily before meals.  If you are age 45 or older, your body mass index should be between 23-30. Your Body mass index is 39.65 kg/m. If this is out of the aforementioned range listed, please consider follow up with your Primary Care Provider.  If you are age 74 or younger, your body mass index should be between 19-25. Your Body mass index is 39.65 kg/m. If this is out of the aformentioned range listed, please consider follow up with your Primary Care Provider.   Due to recent changes in healthcare laws, you may see the results of your imaging and laboratory studies on MyChart before your provider has had a chance to review them.  We understand that in some cases there may be results that are confusing or concerning to you. Not all laboratory results come back in the same time frame and the provider may be waiting for multiple results in order to interpret others.  Please give [place] 48 hours in order for your provider to thoroughly review all the results before contacting the office for clarification of your results.

## 2020-07-02 NOTE — Progress Notes (Signed)
Subjective:    Patient ID: Ann Porter, female    DOB: 1976/02/27, 44 y.o.   MRN: [id number]  HPI Ann Porter is a 44 year old female with a history of GERD with esophagitis, small hiatal hernia, sessile serrated colon polyps, hypertension, diabetes, hyperlipidemia who is seen in follow-up.  She is here alone today and was initially seen on 02/29/2020 by Carl Best, NP and by me for upper endoscopy and colonoscopy on 04/28/2020.  EGD revealed LA grade B esophagitis, 3 cm hiatal hernia, and mild inflammation with edema and erythema in the gastric antrum.  Biopsies from the distal esophagus show mild chronic inflammation and reactive changes.  Negative for increased eosinophils.  Gastric biopsy showed mild reactive gastropathy and intestinal metaplasia with no evidence of dysplasia.  No H. pylori.  Colonoscopy revealed 4 polyps ranging in size from 3 to 6 mm, diverticulosis in the left colon.  2 of these polyps were sessile serrated polyps, the other 2 hyperplastic.  After her endoscopy she was started on pantoprazole 40 mg twice daily which she has continued.  She reports improvement in her heartburn and acid reflux though she is still having issues with epigastric pressure and fullness after eating as well as trouble swallowing.  At times it feels like solid food stops in her lower chest before it enters her stomach.  She also has some early satiety symptom in the epigastrium just below the xiphoid immediately after eating.  It feels like it takes longer for food to reach her stomach and this is uncomfortable.  She does still have some throat clearing.  Review of Systems As per HPI, otherwise negative  Current Medications, Allergies, Past Medical History, Past Surgical History, Family History and Social History were reviewed in [employer] record.      Objective:   Physical Exam BP (!) 144/84   Pulse 84   Ht _0  (1.626 m)   Wt 231 lb (104.8 kg)   BMI  39.65 kg/m  Gen: awake, alert, NAD HEENT: anicteric, op clear CV: RRR, no mrg Pulm: CTA b/l Abd: soft, NT/ND, +BS throughout Ext: no c/c/e Neuro: nonfocal  Abdominal ultrasound performed 12/12/2019 at outside facility in [place], [place] Impression: Hepatic steatosis, subcentimeter nonobstructing renal colliculi bilaterally.  No hydronephrosis.      Assessment & Plan:  44 year old female with a history of GERD with esophagitis, small hiatal hernia, sessile serrated colon polyps, hypertension, diabetes, hyperlipidemia who is seen in follow-up.   1.  GERD with esophagitis/dysphagia and epigastric fullness --heartburn is improved with twice daily pantoprazole but she still having dysphagia symptom and also upper abdominal pressure after eating.  We discussed the differential which includes esophageal stricture/restriction at her GE junction, symptomatic hiatal hernia or gastroparesis.  She is not having nausea or vomiting.  After discussion I recommended the following --Continue pantoprazole 40 mg twice daily AC --Repeat upper endoscopy to document healing of esophagitis and if healed consider empiric dilation --If after empiric dilation no response consider 4-hour gastric emptying study and if normal consider hiatal hernia repair  2.  Sessile serrated colon polyps --5-year surveillance colonoscopy which would be June 2026  3.  Isolated elevated alk phos --GGT normal, fractionated alkaline phosphatase normal.  ANA, anti-smooth muscle, antimitochondrial antibodies negative.  She does have a history of fatty liver which may explain isolated elevated alk phos though this is not typical.  Follow going forward.  No evidence for advanced liver disease  30 minutes total spent today including  patient facing time, coordination of care, reviewing medical history/procedures/pertinent radiology studies, and documentation of the encounter.

## 2020-07-03 ENCOUNTER — Telehealth: Payer: Self-pay | Admitting: *Deleted

## 2020-07-03 NOTE — Telephone Encounter (Signed)
Patient's mobile # stated invalid #. Lowella Fairy, husband on DPR, LVM advising him I was unable to reach her,requested he have her call office for lab results.

## 2020-07-03 NOTE — Telephone Encounter (Signed)
Patient returned call. I informed her that her glucose was elevated, which may be due to not fasting or her diabetes. Advised she continue follow up with pcp on that. Otherwise labs stable, tsh normal. Patient verbalized understanding, appreciation.

## 2020-07-08 ENCOUNTER — Encounter: Payer: Managed Care, Other (non HMO) | Admitting: Internal Medicine

## 2020-07-10 ENCOUNTER — Ambulatory Visit (INDEPENDENT_AMBULATORY_CARE_PROVIDER_SITE_OTHER): Payer: Managed Care, Other (non HMO)

## 2020-07-10 ENCOUNTER — Other Ambulatory Visit: Payer: Self-pay | Admitting: Internal Medicine

## 2020-07-10 DIAGNOSIS — Z1159 Encounter for screening for other viral diseases: Secondary | ICD-10-CM

## 2020-07-10 LAB — SARS CORONAVIRUS 2 (TAT 6-24 HRS): SARS Coronavirus 2: NEGATIVE

## 2020-07-13 ENCOUNTER — Encounter: Payer: Self-pay | Admitting: Certified Registered Nurse Anesthetist

## 2020-07-14 ENCOUNTER — Ambulatory Visit (AMBULATORY_SURGERY_CENTER): Payer: Managed Care, Other (non HMO) | Admitting: Internal Medicine

## 2020-07-14 ENCOUNTER — Encounter: Payer: Self-pay | Admitting: Internal Medicine

## 2020-07-14 ENCOUNTER — Other Ambulatory Visit: Payer: Managed Care, Other (non HMO)

## 2020-07-14 ENCOUNTER — Other Ambulatory Visit: Payer: Self-pay

## 2020-07-14 VITALS — BP 111/57 | HR 51 | Temp 97.8°F | Resp 18 | Ht 64.0 in | Wt 231.0 lb

## 2020-07-14 DIAGNOSIS — K2100 Gastro-esophageal reflux disease with esophagitis, without bleeding: Secondary | ICD-10-CM

## 2020-07-14 DIAGNOSIS — K449 Diaphragmatic hernia without obstruction or gangrene: Secondary | ICD-10-CM

## 2020-07-14 DIAGNOSIS — R1310 Dysphagia, unspecified: Secondary | ICD-10-CM | POA: Diagnosis not present

## 2020-07-14 MED ORDER — SODIUM CHLORIDE 0.9 % IV SOLN: 500.0000 mL | Freq: Once | INTRAVENOUS | Status: DC

## 2020-07-14 NOTE — Progress Notes (Signed)
Pt's states no medical or surgical changes since previsit or office visit.  Vitals- Sheila 

## 2020-07-14 NOTE — Op Note (Signed)
[facility] Patient Name: Ann Porter Procedure Date: 07/14/2020 11:35 AM MRN: [id number] Endoscopist: Jerene Bears , MD Age: 44 Referring MD:  Date of Birth: [birth year] Gender: Female Account #: [id number] Procedure:                Upper GI endoscopy Indications:              Dysphagia, Follow-up of reflux esophagitis,                            epigastric fullness/pressure, last EGD June 2021                            with LA Grade B esophagitis, recent on BID PPI Medicines:                Monitored Anesthesia Care Procedure:                Pre-Anesthesia Assessment:                           - Prior to the procedure, a History and Physical                            was performed, and patient medications and                            allergies were reviewed. The patient's tolerance of                            previous anesthesia was also reviewed. The risks                            and benefits of the procedure and the sedation                            options and risks were discussed with the patient.                            All questions were answered, and informed consent                            was obtained. Prior Anticoagulants: The patient has                            taken no previous anticoagulant or antiplatelet                            agents. ASA Grade Assessment: II - A patient with                            mild systemic disease. After reviewing the risks                            and benefits, the patient was deemed in  satisfactory condition to undergo the procedure.                           After obtaining informed consent, the endoscope was                            passed under direct vision. Throughout the                            procedure, the patient's blood pressure, pulse, and                            oxygen saturations were monitored continuously. The                            Endoscope was  introduced through the mouth, and                            advanced to the second part of duodenum. The upper                            GI endoscopy was accomplished without difficulty.                            The patient tolerated the procedure well. Scope In: Scope Out: Findings:                 LA Grade A (one or more mucosal breaks less than 5                            mm, not extending between tops of 2 mucosal folds)                            esophagitis with no bleeding was found at the                            gastroesophageal junction. Esophagitis has                            improved, but not completely resolved. A TTS                            dilator was passed through the scope. Dilation with                            a 16-17-18 mm balloon dilator was performed in the                            lower esophagus and across the GE junction to 17                            mm. The dilation site was examined and showed  moderate mucosal disruption.                           A 3 cm hiatal hernia was present.                           The entire examined stomach was normal.                           The examined duodenum was normal. Complications:            No immediate complications. Estimated Blood Loss:     Estimated blood loss was minimal. Impression:               - Improved reflux esophagitis, though still mild                            reflux esophagitis with no bleeding. Dilated to 17                            mm with balloon.                           - 3 cm hiatal hernia.                           - Normal stomach.                           - Normal examined duodenum.                           - No specimens collected. Recommendation:           - Patient has a contact number available for                            emergencies. The signs and symptoms of potential                            delayed complications were discussed  with the                            patient. Return to normal activities tomorrow.                            Written discharge instructions were provided to the                            patient.                           - Resume previous diet.                           - Continue present medications including twice  daily pantoprazole.                           - Anti-reflux diet.                           - Please call my office in 4 weeks to let me know                            if reflux, upper abdominal and swallowing trouble                            has improved. Jerene Bears, MD 07/14/2020 12:09:02 PM This report has been signed electronically.

## 2020-07-14 NOTE — Progress Notes (Signed)
Report given to PACU, vss 

## 2020-07-14 NOTE — Patient Instructions (Signed)
FOLLOW DILATATION DIET TODAY- HANDOUT GIVEN TO YOU   HANDOUT ON REFLUX PRECAUTIONS ALSO GIVEN TO YOU  FOLLOW ANTI REFLUX DIET  CALL DR. PYRTLE'S OFFICE IN 4 WEEKS TO LET HIM KNOW IF ABDOMINAL PAIN AND SWALLOWING TROUBLE HAVE IMPROVED  CONTINUE PRESENT MEDICATIONS INCLUDING TWICE DAILY PANTOPRAZOLE     YOU HAD AN ENDOSCOPIC PROCEDURE TODAY AT [facility]:   Refer to the procedure report that was given to you for any specific questions about what was found during the examination.  If the procedure report does not answer your questions, please call your gastroenterologist to clarify.  If you requested that your care partner not be given the details of your procedure findings, then the procedure report has been included in a sealed envelope for you to review at your convenience later.  YOU SHOULD EXPECT: Some feelings of bloating in the abdomen. Passage of more gas than usual.  Walking can help get rid of the air that was put into your GI tract during the procedure and reduce the bloating. If you had a lower endoscopy (such as a colonoscopy or flexible sigmoidoscopy) you may notice spotting of blood in your stool or on the toilet paper. If you underwent a bowel prep for your procedure, you may not have a normal bowel movement for a few days.  Please Note:  You might notice some irritation and congestion in your nose or some drainage.  This is from the oxygen used during your procedure.  There is no need for concern and it should clear up in a day or so.  SYMPTOMS TO REPORT IMMEDIATELY:     Following upper endoscopy (EGD)  Vomiting of blood or coffee ground material  New chest pain or pain under the shoulder blades  Painful or persistently difficult swallowing  New shortness of breath  Fever of 100F or higher  Black, tarry-looking stools  For urgent or emergent issues, a gastroenterologist can be reached at any hour by calling [phone]. Do not use MyChart  messaging for urgent concerns.    DIET:  We do recommend a small meal at first, but then you may proceed to your regular diet.  Drink plenty of fluids but you should avoid alcoholic beverages for 24 hours.  ACTIVITY:  You should plan to take it easy for the rest of today and you should NOT DRIVE or use heavy machinery until tomorrow (because of the sedation medicines used during the test).    FOLLOW UP: Our staff will call the number listed on your records 48-72 hours following your procedure to check on you and address any questions or concerns that you may have regarding the information given to you following your procedure. If we do not reach you, we will leave a message.  We will attempt to reach you two times.  During this call, we will ask if you have developed any symptoms of COVID 19. If you develop any symptoms (ie: fever, flu-like symptoms, shortness of breath, cough etc.) before then, please call [phone].  If you test positive for Covid 19 in the 2 weeks post procedure, please call and report this information to [place].    If any biopsies were taken you will be contacted by phone or by letter within the next 1-3 weeks.  Please call us at [phone] if you have not heard about the biopsies in 3 weeks.    SIGNATURES/CONFIDENTIALITY: You and/or your care partner have signed paperwork which will be entered  into your electronic medical record.  These signatures attest to the fact that that the information above on your After Visit Summary has been reviewed and is understood.  Full responsibility of the confidentiality of this discharge information lies with you and/or your care-partner.

## 2020-07-14 NOTE — Progress Notes (Signed)
Called to room to assist during endoscopic procedure.  Patient ID and intended procedure confirmed with present staff. Received instructions for my participation in the procedure from the performing physician.  

## 2020-07-16 ENCOUNTER — Telehealth: Payer: Self-pay

## 2020-07-16 ENCOUNTER — Telehealth: Payer: Self-pay | Admitting: *Deleted

## 2020-07-16 NOTE — Telephone Encounter (Signed)
Left message on follow up call. 

## 2020-07-16 NOTE — Telephone Encounter (Signed)
No answer for post procedure call back. Left message for patient to call with questions or concerns. 

## 2020-07-17 ENCOUNTER — Ambulatory Visit
Admission: RE | Admit: 2020-07-17 | Discharge: 2020-07-17 | Disposition: A | Discharge status: Home / Self Care | Payer: Managed Care, Other (non HMO) | Source: Ambulatory Visit | Attending: Neurology | Admitting: Neurology

## 2020-07-17 ENCOUNTER — Other Ambulatory Visit: Payer: Self-pay

## 2020-07-17 DIAGNOSIS — I8290 Acute embolism and thrombosis of unspecified vein: Secondary | ICD-10-CM

## 2020-07-17 DIAGNOSIS — H4710 Unspecified papilledema: Secondary | ICD-10-CM

## 2020-07-17 DIAGNOSIS — G932 Benign intracranial hypertension: Secondary | ICD-10-CM

## 2020-07-17 DIAGNOSIS — R51 Headache with orthostatic component, not elsewhere classified: Secondary | ICD-10-CM

## 2020-07-17 DIAGNOSIS — H547 Unspecified visual loss: Secondary | ICD-10-CM

## 2020-07-17 DIAGNOSIS — G441 Vascular headache, not elsewhere classified: Secondary | ICD-10-CM

## 2020-07-17 MED ORDER — IOPAMIDOL (ISOVUE-370) INJECTION 76%
75.0000 mL | Freq: Once | INTRAVENOUS | Status: AC | PRN
  Administered 2020-07-17: 75 mL via INTRAVENOUS

## 2020-07-22 ENCOUNTER — Other Ambulatory Visit: Payer: Self-pay

## 2020-07-22 ENCOUNTER — Ambulatory Visit
Admission: RE | Admit: 2020-07-22 | Discharge: 2020-07-22 | Disposition: A | Discharge status: Home / Self Care | Payer: Managed Care, Other (non HMO) | Source: Ambulatory Visit | Attending: Neurology | Admitting: Neurology

## 2020-07-22 DIAGNOSIS — R51 Headache with orthostatic component, not elsewhere classified: Secondary | ICD-10-CM

## 2020-07-22 DIAGNOSIS — I8290 Acute embolism and thrombosis of unspecified vein: Secondary | ICD-10-CM

## 2020-07-22 DIAGNOSIS — H4710 Unspecified papilledema: Secondary | ICD-10-CM

## 2020-07-22 DIAGNOSIS — H547 Unspecified visual loss: Secondary | ICD-10-CM

## 2020-07-22 DIAGNOSIS — G441 Vascular headache, not elsewhere classified: Secondary | ICD-10-CM | POA: Diagnosis not present

## 2020-07-22 DIAGNOSIS — G932 Benign intracranial hypertension: Secondary | ICD-10-CM

## 2020-07-22 MED ORDER — GADOBENATE DIMEGLUMINE 529 MG/ML IV SOLN
20.0000 mL | Freq: Once | INTRAVENOUS | Status: AC | PRN
  Administered 2020-07-22: 20 mL via INTRAVENOUS

## 2020-07-28 ENCOUNTER — Ambulatory Visit
Admission: RE | Admit: 2020-07-28 | Discharge: 2020-07-28 | Disposition: A | Discharge status: Home / Self Care | Payer: Managed Care, Other (non HMO) | Source: Ambulatory Visit | Attending: Neurology | Admitting: Neurology

## 2020-07-28 ENCOUNTER — Other Ambulatory Visit: Payer: Self-pay

## 2020-07-28 VITALS — BP 134/54 | HR 67

## 2020-07-28 DIAGNOSIS — G441 Vascular headache, not elsewhere classified: Secondary | ICD-10-CM

## 2020-07-28 DIAGNOSIS — G932 Benign intracranial hypertension: Secondary | ICD-10-CM

## 2020-07-28 DIAGNOSIS — I8290 Acute embolism and thrombosis of unspecified vein: Secondary | ICD-10-CM

## 2020-07-28 DIAGNOSIS — R51 Headache with orthostatic component, not elsewhere classified: Secondary | ICD-10-CM

## 2020-07-28 DIAGNOSIS — H547 Unspecified visual loss: Secondary | ICD-10-CM

## 2020-07-28 DIAGNOSIS — H4710 Unspecified papilledema: Secondary | ICD-10-CM

## 2020-07-28 LAB — CSF CELL COUNT WITH DIFFERENTIAL
RBC Count, CSF: 0 cells/uL
WBC, CSF: 1 cells/uL (ref 0–5)

## 2020-07-28 LAB — PROTEIN, CSF: Total Protein, CSF: 40 mg/dL (ref 15–45)

## 2020-07-28 LAB — GLUCOSE, CSF: Glucose, CSF: 69 mg/dL (ref 40–80)

## 2020-07-28 NOTE — Discharge Instructions (Signed)

## 2020-07-29 ENCOUNTER — Telehealth: Payer: Self-pay | Admitting: *Deleted

## 2020-07-29 NOTE — Telephone Encounter (Signed)
-----   Message from Melvenia Beam, MD sent at 07/28/2020  7:11 PM EDT ----- Fluid from around your spine is normal. An opening pressure of 23 is slightly abnormal but not significantly elevated(can be normal in a lot of people). If she  likes we can start a small dose of diamox that she was on in the past to see if it helps.

## 2020-07-29 NOTE — Telephone Encounter (Signed)
I called the pt and LVM (ok per DPR) advising pt of CSF lab results from Dr Jaynee Eagles and offered Diamox. Left office number and hours in message and asked for a call back to let us know what she would like to do.

## 2020-07-31 NOTE — Telephone Encounter (Signed)
Results from Dr Jaynee Eagles: Fluid from around your spine is normal. An opening pressure of 23 is slightly abnormal but not significantly elevated(can be normal in a lot of people). If she likes we can start a small dose of diamox that she was on in the past to see if it helps.

## 2020-09-15 ENCOUNTER — Ambulatory Visit: Payer: Managed Care, Other (non HMO) | Admitting: Neurology

## 2020-11-17 ENCOUNTER — Ambulatory Visit: Payer: Managed Care, Other (non HMO) | Admitting: Nurse Practitioner

## 2020-11-21 ENCOUNTER — Encounter: Payer: Self-pay | Admitting: Nurse Practitioner

## 2020-11-21 ENCOUNTER — Other Ambulatory Visit: Payer: Self-pay

## 2020-11-21 ENCOUNTER — Ambulatory Visit (INDEPENDENT_AMBULATORY_CARE_PROVIDER_SITE_OTHER): Payer: Managed Care, Other (non HMO) | Admitting: Nurse Practitioner

## 2020-11-21 ENCOUNTER — Other Ambulatory Visit (INDEPENDENT_AMBULATORY_CARE_PROVIDER_SITE_OTHER): Payer: Managed Care, Other (non HMO)

## 2020-11-21 VITALS — BP 90/64 | HR 96 | Ht 63.5 in | Wt 228.4 lb

## 2020-11-21 DIAGNOSIS — R1013 Epigastric pain: Secondary | ICD-10-CM | POA: Diagnosis not present

## 2020-11-21 DIAGNOSIS — R1033 Periumbilical pain: Secondary | ICD-10-CM

## 2020-11-21 DIAGNOSIS — R1030 Lower abdominal pain, unspecified: Secondary | ICD-10-CM

## 2020-11-21 LAB — LIPASE: Lipase: 59 U/L (ref 11.0–59.0)

## 2020-11-21 LAB — CBC WITH DIFFERENTIAL/PLATELET
Basophils Absolute: 0.1 10*3/uL (ref 0.0–0.1)
Basophils Relative: 0.9 % (ref 0.0–3.0)
Eosinophils Absolute: 0.1 10*3/uL (ref 0.0–0.7)
Eosinophils Relative: 1.1 % (ref 0.0–5.0)
HCT: 48.4 % — ABNORMAL HIGH (ref 36.0–46.0)
Hemoglobin: 16.7 g/dL — ABNORMAL HIGH (ref 12.0–15.0)
Lymphocytes Relative: 23.1 % (ref 12.0–46.0)
Lymphs Abs: 2.3 10*3/uL (ref 0.7–4.0)
MCHC: 34.5 g/dL (ref 30.0–36.0)
MCV: 91.5 fl (ref 78.0–100.0)
Monocytes Absolute: 0.9 10*3/uL (ref 0.1–1.0)
Monocytes Relative: 9.1 % (ref 3.0–12.0)
Neutro Abs: 6.5 10*3/uL (ref 1.4–7.7)
Neutrophils Relative %: 65.8 % (ref 43.0–77.0)
Platelets: 299 10*3/uL (ref 150.0–400.0)
RBC: 5.29 Mil/uL — ABNORMAL HIGH (ref 3.87–5.11)
RDW: 13 % (ref 11.5–15.5)
WBC: 9.9 10*3/uL (ref 4.0–10.5)

## 2020-11-21 LAB — COMPREHENSIVE METABOLIC PANEL
ALT: 25 U/L (ref 0–35)
AST: 17 U/L (ref 0–37)
Albumin: 4.6 g/dL (ref 3.5–5.2)
Alkaline Phosphatase: 133 U/L — ABNORMAL HIGH (ref 39–117)
BUN: 21 mg/dL (ref 6–23)
CO2: 26 mEq/L (ref 19–32)
Calcium: 10.2 mg/dL (ref 8.4–10.5)
Chloride: 104 mEq/L (ref 96–112)
Creatinine, Ser: 1.04 mg/dL (ref 0.40–1.20)
GFR: 64.82 mL/min (ref >60.00)
Glucose, Bld: 114 mg/dL — ABNORMAL HIGH (ref 70–99)
Potassium: 4.4 mEq/L (ref 3.5–5.1)
Sodium: 137 mEq/L (ref 135–145)
Total Bilirubin: 0.5 mg/dL (ref 0.2–1.2)
Total Protein: 7.2 g/dL (ref 6.0–8.3)

## 2020-11-21 LAB — C-REACTIVE PROTEIN: CRP: <1 mg/dL (ref 0.5–20.0)

## 2020-11-21 MED ORDER — FAMOTIDINE 40 MG PO TABS: 40.0000 mg | ORAL_TABLET | Freq: Every day | ORAL | 12 refills | Status: DC

## 2020-11-21 NOTE — Patient Instructions (Signed)
If you are age 46 or older, your body mass index should be between 23-30. Your Body mass index is 39.82 kg/m. If this is out of the aforementioned range listed, please consider follow up with your Primary Care Provider.  If you are age 42 or younger, your body mass index should be between 19-25. Your Body mass index is 39.82 kg/m. If this is out of the aformentioned range listed, please consider follow up with your Primary Care Provider.   OVER THE COUNTER MEDICATION  Please purchase the following medications over the counter and take as directed:       Miralax. Dissolve one capful in 8 ounces of water and drink before bed. MEDICATION  We have sent the following medication to your pharmacy for you to pick up at your convenience:     Famotidine 40 MG tablet, take 1 at bedtime.  Please take Pantoprazole 40 MG tablet. Take 1 tablet 30 minutes before breakfast and dinner.  Please call our office if your symptoms worsen.   You have been scheduled for a CT scan of the abdomen and pelvis at [facility] ([address]---this is in the same building as [employer]).   You are scheduled on 11/24/20 at 11:30AM. You should arrive 15 minutes prior to your appointment time for registration. Please follow the written instructions below on the day of your exam:  WARNING: IF YOU ARE ALLERGIC TO IODINE/X-RAY DYE, PLEASE NOTIFY RADIOLOGY IMMEDIATELY AT [phone]! YOU WILL BE GIVEN A 13 HOUR PREMEDICATION PREP.  1) Do not eat or drink anything after 7:30AM (4 hours prior to your test) 2) You have been given 2 bottles of oral contrast to drink. The solution may taste better if refrigerated, but do NOT add ice or any other liquid to this solution. Shake well before drinking.    Drink 1 bottle of contrast @ 9:30AM (2 hours prior to your exam)  Drink 1 bottle of contrast @ 10:30AM (1 hour prior to your exam)  You may take any medications as prescribed with a small amount of  water, if necessary. If you take any of the following medications: METFORMIN, GLUCOPHAGE, GLUCOVANCE, AVANDAMET, RIOMET, FORTAMET, [place] MET, JANUMET, GLUMETZA or METAGLIP, you MAY be asked to HOLD this medication 48 hours AFTER the exam.  The purpose of you drinking the oral contrast is to aid in the visualization of your intestinal tract. The contrast solution may cause some diarrhea. Depending on your individual set of symptoms, you may also receive an intravenous injection of x-ray contrast/dye. Plan on being at [facility] for 30 minutes or longer, depending on the type of exam you are having performed.  This test typically takes 30-45 minutes to complete.  If you have any questions regarding your exam or if you need to reschedule, you may call the CT department at [phone] between the hours of 8:00 am and 5:00 pm, Monday-Friday.  ________________________________________________________________________  LABS: Your provider has requested that you go to the basement level for lab work before leaving today. Press "B" on the elevator. The lab is located at the first door on the left as you exit the elevator.  HEALTHCARE LAWS AND MY CHART RESULTS: Due to recent changes in healthcare laws, you may see the results of your imaging and laboratory studies on MyChart before your provider has had a chance to review them.  We understand that in some cases there may be results that are confusing or concerning to you. Not all  laboratory results come back in the same time frame and the provider may be waiting for multiple results in order to interpret others.  Please give [place] 48 hours in order for your provider to thoroughly review all the results before contacting the office for clarification of your results.   It was great seeing you today!  Thank you for entrusting me with your care and choosing [facility].  Noralyn Pick, CRNP

## 2020-11-21 NOTE — Progress Notes (Signed)
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  S/P total abdominal hysterectomy, D & C and C section x 2.     She underwent an EGD due to having GERD symptoms and dysphagia and a screening colonoscopy by Dr. Hilarie Fredrickson 04/28/2020.  The EGD identified reflux esophagitis, gastritis and a 3 cm hiatal hernia.  No evidence of H. pylori.  She was prescribed Pantoprazole 40 mg p.o. twice daily.  The colonoscopy identified 2 sessile serrated polyps removed from the ascending colon and 1 hyperplastic polyp removed from the sigmoid colon and rectum.  A repeat colonoscopy in 5 years was recommended.  Diverticulosis noted to the sigmoid and descending colon.  She was seen in the office for follow-up by Dr. Hilarie Fredrickson on 07/02/2020, at that time, her reflux symptoms improved on Pantoprazole 40 mg twice daily, ever, she continued to have complaints of epigastric pressure and fullness with dysphagia.  Repeat EGD was done on 07/14/2020 which showed improved reflux esophagitis and the esophagus was empirically dilated.  Again noted was a 3 cm hiatal hernia.  The stomach and duodenum appeared normal therefore biopsies were not obtained.  Dr. Hilarie Fredrickson discussed scheduling a gastric emptying study and to consider hiatal hernia repair if her symptoms recurred.  She presents to our office today for further evaluation regarding burning upper abdominal pain.  She complains of early satiety.  She feels as if her upper abdomen is distended and hard.  She typically passed a normal formed brown bowel movement daily.  However, for the past 2 weeks she is passing a normal formed brown bowel movement  every other day.  No nausea or vomiting.  No fever.  Her GERD is fairly well controlled during the daytime on Pantoprazole 40 mg p.o. twice daily but she is having nighttime reflux symptoms.  She awakens in the middle the night with acid backing up her esophagus which results in coughing and occurs once or twice weekly.  No current dysphagia.   EGD 07/14/2020: - Improved reflux esophagitis, though still mild reflux esophagitis with no bleeding. Dilated to 17 mm with balloon. - 3 cm hiatal hernia. - Normal stomach. - Normal examined duodenum. - No specimens collected.  EGD 04/28/2020 by Dr. Hilarie Fredrickson: - Reflux esophagitis with no bleeding. Biopsied. - 3 cm hiatal hernia. - Gastritis. Biopsied. - Normal examined duodenum.  Colonoscopy 04/28/2020: - Two 5 to 6 mm polyps in the ascending colon, removed with a cold snare. Resected and retrieved. - One 6 mm polyp in the sigmoid colon, removed with a cold snare. Resected and retrieved. - One 3 mm polyp in the rectum, removed with a cold snare. Resected and retrieved. - Diverticulosis in the sigmoid colon and in the distal descending colon. - The distal rectum and anal verge are normal on retroflexion view. Biopsy results: 1. Surgical [P], gastric antrum and gastric body - GASTRIC ANTRAL MUCOSA WITH MILD REACTIVE GASTROPATHY AND INTESTINAL METAPLASIA PRESENT FOCALLY, NEGATIVE FOR DYSPLASIA. - GASTRIC OXYNTIC MUCOSA WITH MILD CHRONIC GASTRITIS. - WARTHIN-STARRY STAIN IS NEGATIVE FOR HELICOBACTER PYLORI. 2. Surgical [P], distal esophagus - SQUAMOUS ESOPHAGEAL EPITHELIUM WITH MILD CHRONIC INFLAMMATION AND REACTIVE CHANGES. - NEGATIVE FOR INCREASED INTRAEPITHELIAL EOSINOPHILS. 3. Surgical [P], ascending, polyps (2) -  SESSILE SERRATED POLYP WITHOUT CYTOLOGIC DYSPLASIA (X2). 4. Surgical [P], sigmoid, rectal, polyps (2) - HYPERPLASTIC POLYP (X3).  Current Outpatient Medications on File Prior to Visit  Medication Sig Dispense Refill  . atorvastatin  (LIPITOR) 40 MG tablet Take 40 mg by mouth daily.    Marland Kitchen FARXIGA 10 MG TABS tablet Take 10 mg by mouth daily.    . hydrochlorothiazide (HYDRODIURIL) 12.5 MG tablet Take 12.5 mg by mouth daily.    Marland Kitchen lisinopril (ZESTRIL) 40 MG tablet Take 40 mg by mouth daily.    . metFORMIN (GLUCOPHAGE) 500 MG tablet Take 500 mg by mouth daily.    . pantoprazole (PROTONIX) 40 MG tablet Take 1 tablet (40 mg total) by mouth 2 (two) times daily. Please take 30 min before 1st and last meal of the day. 90 tablet 3   No current facility-administered medications on file prior to visit.   Allergies  Allergen Reactions  . Sulfa Antibiotics Hives   Current Medications, Allergies, Past Medical History, Past Surgical History, Family History and Social History were reviewed in [employer] record.   Review of Systems:   Constitutional: Negative for fever, sweats, chills or weight loss.  Respiratory: Negative for shortness of breath.   Cardiovascular: Negative for chest pain, palpitations and leg swelling.  Gastrointestinal: See HPI.  Musculoskeletal: Negative for back pain or muscle aches.  Neurological: Negative for dizziness, headaches or paresthesias.    Physical Exam: BP 90/64 (BP Location: Left Arm, Patient Position: Sitting, Cuff Size: Normal)   Pulse 96   Ht 5' 3.5" (1.613 m) Comment: height measured without shoes  Wt 228 lb 6 oz (103.6 kg)   BMI 39.82 kg/m   Wt Readings from Last 3 Encounters:  11/21/20 228 lb 6 oz (103.6 kg)  07/14/20 231 lb (104.8 kg)  07/02/20 231 lb (104.8 kg)   General: Obese 45 year old female in no acute distress. Head: Normocephalic and atraumatic. Eyes: No scleral icterus. Conjunctiva pink . Ears: Normal auditory acuity. Lungs: Clear throughout to auscultation. Heart: Regular rate and rhythm, no murmur. Abdomen: Soft.  Mild upper abdominal distention.  Moderate epigastric, periumbilical tenderness without rebound or guarding.  Mild generalized lower  abdominal tenderness.  No masses or hepatomegaly. Normal bowel sounds x 4 quadrants.  Rectal: Deferred Musculoskeletal: Symmetrical with no gross deformities. Extremities: No edema. Neurological: Alert oriented x 4. No focal deficits.  Psychological: Alert and cooperative. Normal mood and affect  Assessment and Recommendations:  1.  Epigastric pain with upper abdominal distention.  Early satiety.  EGD 04/28/2020 identified reflux esophagitis, gastritis and a 3 cm hiatal hernia.  A repeat EGD 07/14/2020 showed improved reflux esophagitis her esophagus was empirically dilated.  On exam today, she demonstrated moderate tenderness to the epigastric area and periumbilical area.  Lower abdomen with mild tenderness.  No rebound or guarding. -CTAP with oral and IV (note patient is on Metformin). -Gastric empty study if CTAP unrevealing -CBC, CMP, lipase and CRP  2.  Reflux esophagitis and dysphagia.  3 cm hiatal hernia day time reflux controlled but patient having intermittent nighttime reflux symptoms. -Continue Pantoprazole 40 mg p.o. twice daily -Famotidine  40 mg at bedtime -May require eventual hiatal hernia surgery  3.  Constipation -MiraLAX nightly as tolerated  4.  History of colon polyps -Next colonoscopy due 04/2025  5. Diabetes mellitus type 2 on Farxiga (pancreatitis potential side effect)  To call our office if her symptoms worsen.  Further recommendations to be determined after the above lab and  CTAP results reviewed.

## 2020-11-24 ENCOUNTER — Other Ambulatory Visit: Payer: Managed Care, Other (non HMO)

## 2020-11-24 NOTE — Progress Notes (Signed)
Addendum: Reviewed and agree with assessment and management plan. Blease Capaldi M, MD  

## 2020-11-25 ENCOUNTER — Ambulatory Visit (INDEPENDENT_AMBULATORY_CARE_PROVIDER_SITE_OTHER)
Admission: RE | Admit: 2020-11-25 | Discharge: 2020-11-25 | Disposition: A | Discharge status: Home / Self Care | Payer: Managed Care, Other (non HMO) | Source: Ambulatory Visit | Attending: Nurse Practitioner | Admitting: Nurse Practitioner

## 2020-11-25 ENCOUNTER — Other Ambulatory Visit: Payer: Self-pay

## 2020-11-25 DIAGNOSIS — R1013 Epigastric pain: Secondary | ICD-10-CM

## 2020-11-25 DIAGNOSIS — R1033 Periumbilical pain: Secondary | ICD-10-CM

## 2020-11-25 DIAGNOSIS — R1030 Lower abdominal pain, unspecified: Secondary | ICD-10-CM | POA: Diagnosis not present

## 2020-11-25 IMAGING — CT CT ABD-PELV W/ CM
2 of 5 series · 16 of 46 positions shown, 18 images · IV contrast (OMNIPAQUE 300)
Comparison: None.

CLINICAL DATA: Worsening paraumbilical and lower abdominal pain for
several months. Intermittent epigastric pain. Constipation.

EXAM:
CT ABDOMEN AND PELVIS WITH CONTRAST
TECHNIQUE: Multidetector CT imaging of the abdomen and pelvis was performed
using the standard protocol following bolus administration of
intravenous contrast.
CONTRAST:  100mL OMNIPAQUE IOHEXOL 300 MG/ML  SOLN

[Series 2: abd/pel w · axial · 0.79mm/px · z∈[-432,-27]mm · 13 of 93 slices shown, 15 images]
[im 6/93  soft-tissue]
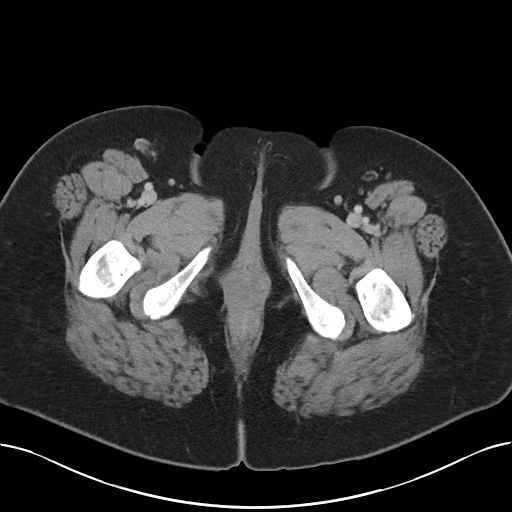
[im 6/93  bone]
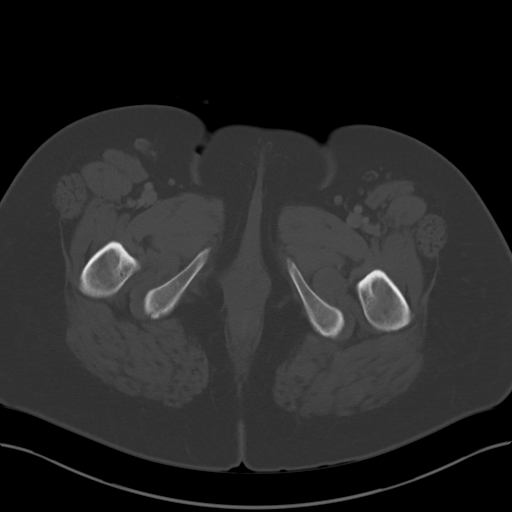
[im 11/93  soft-tissue]
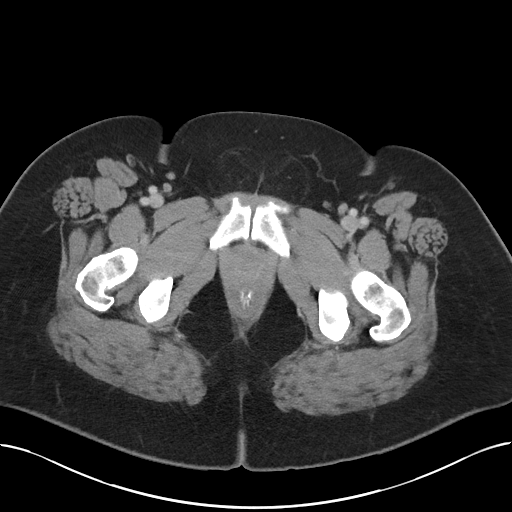
[im 21/93  soft-tissue]
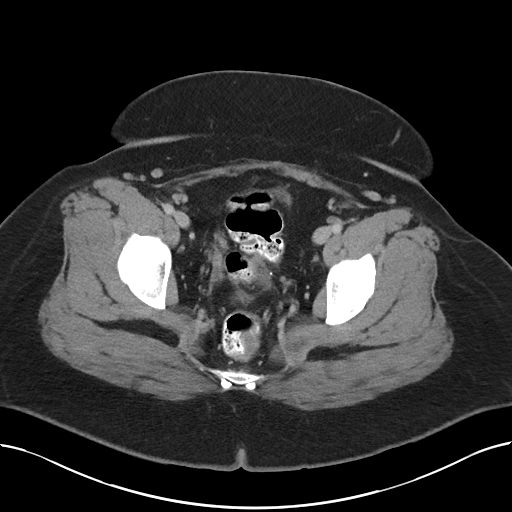
[im 26/93  soft-tissue]
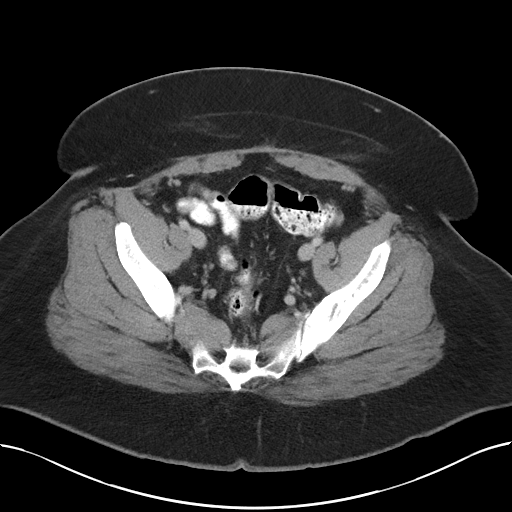
[im 31/93  soft-tissue]
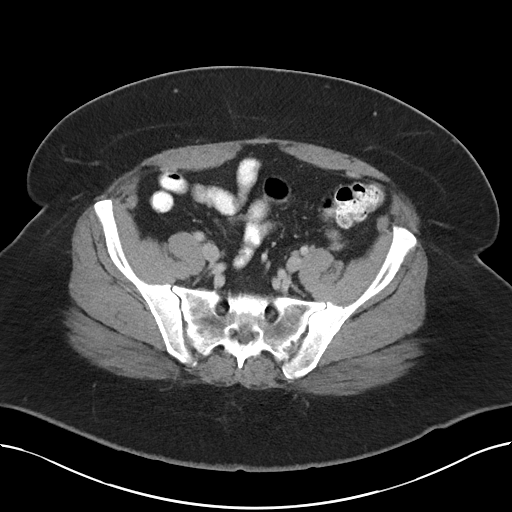
[im 41/93  soft-tissue]
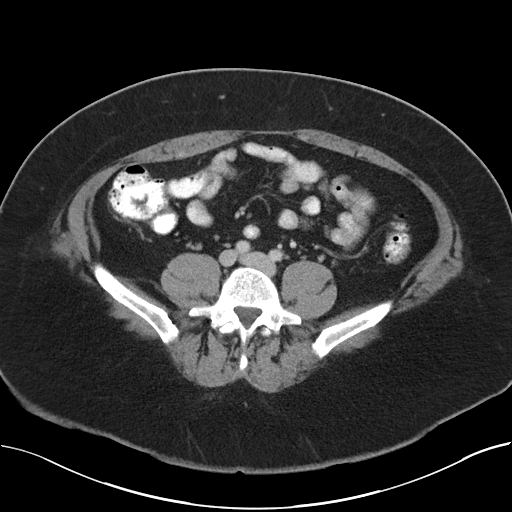
[im 47/93  soft-tissue]
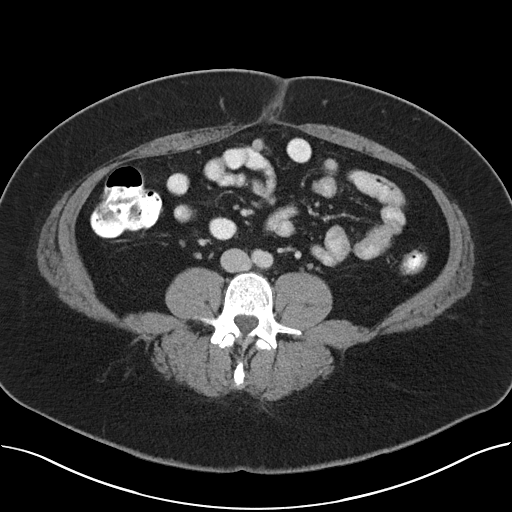
[im 52/93  soft-tissue]
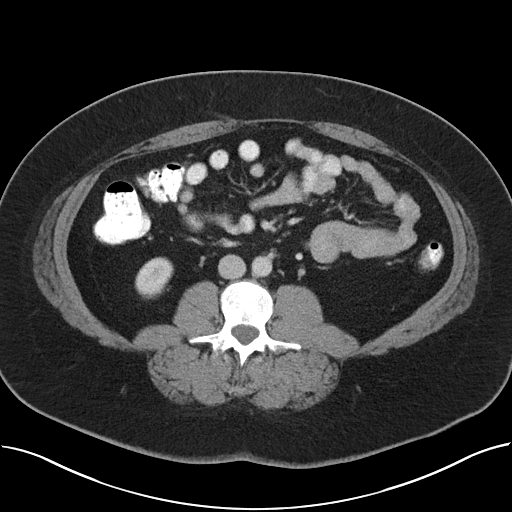
[im 62/93  soft-tissue]
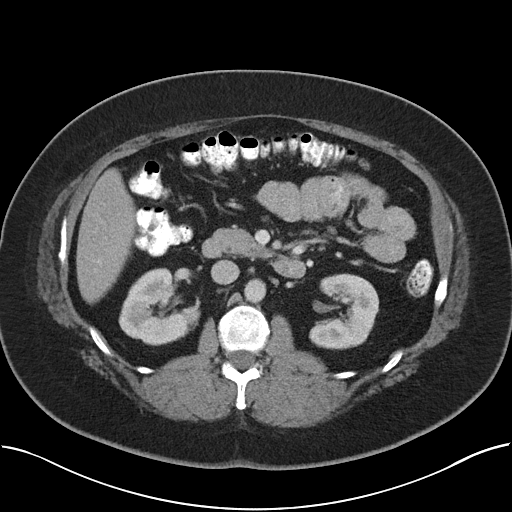
[im 62/93  bone]
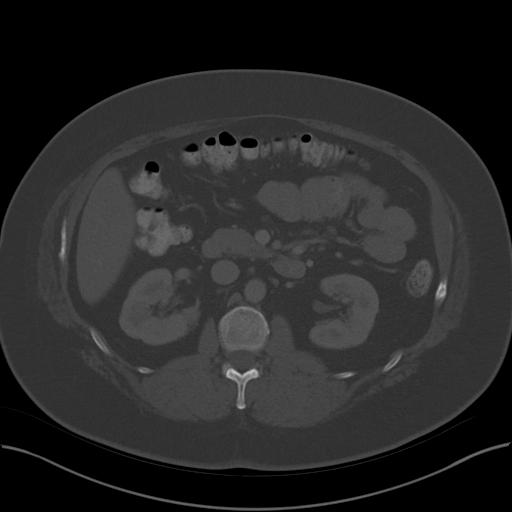
[im 67/93  soft-tissue]
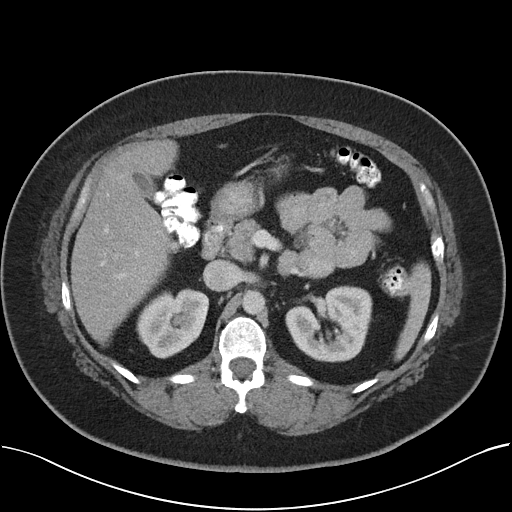
[im 72/93  soft-tissue]
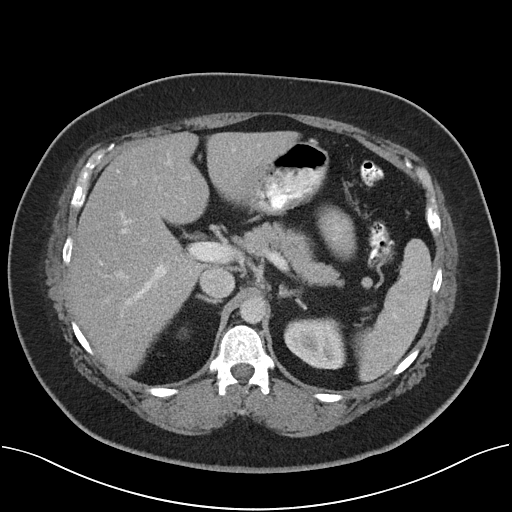
[im 82/93  soft-tissue]
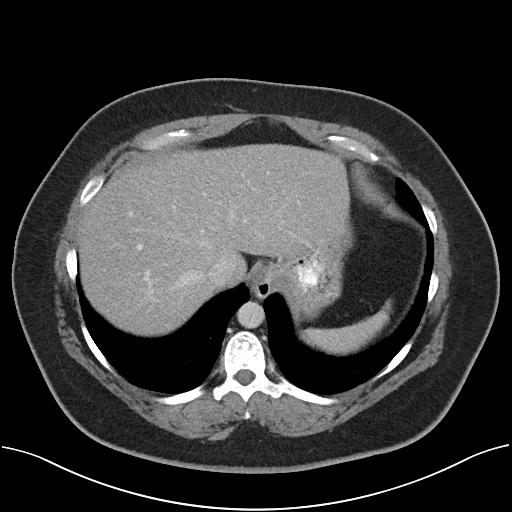
[im 87/93  soft-tissue]
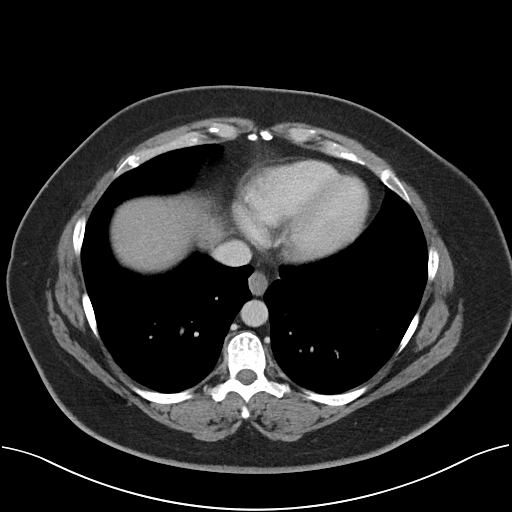

[Series 5: coronal st · coronal · 0.76mm/px · 3 of 91 slices shown]
[im 31/91  soft-tissue]
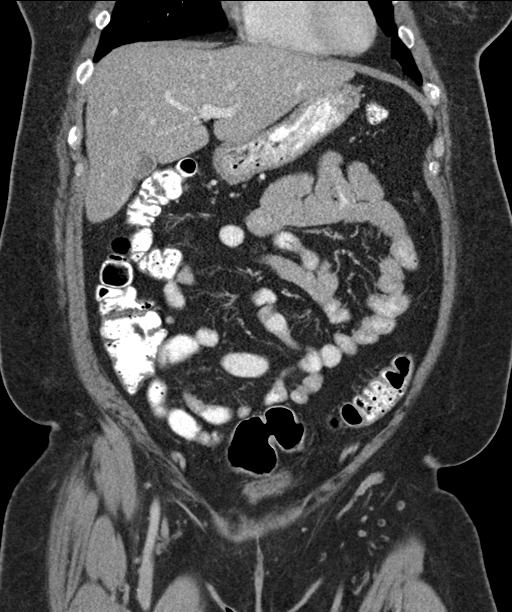
[im 41/91  soft-tissue]
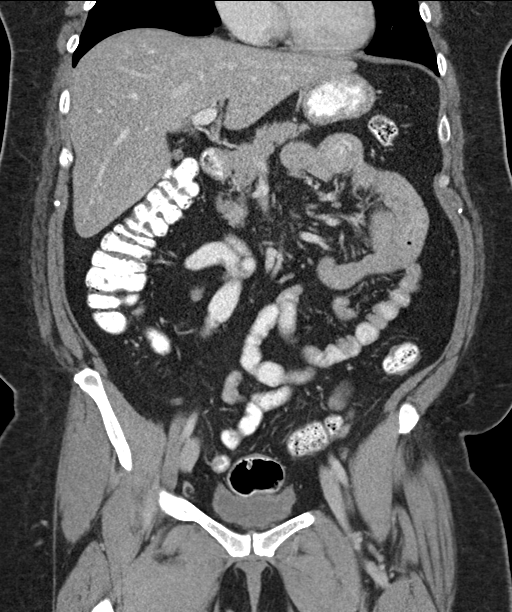
[im 51/91  soft-tissue]
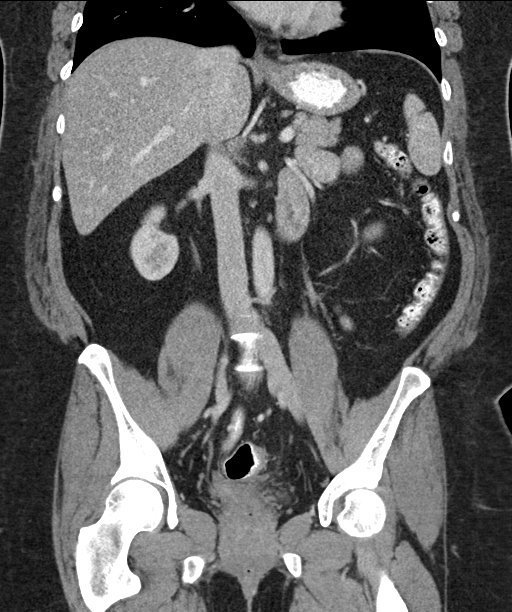

[16 of 46 positions shown; findings below may reference images not displayed]

FINDINGS: Lower Chest: No acute findings.

Hepatobiliary: No hepatic masses identified. Mild diffuse hepatic
steatosis is noted. Gallbladder is unremarkable. No evidence of
biliary ductal dilatation.

Pancreas:  No mass or inflammatory changes.

Spleen: Within normal limits in size and appearance.

Adrenals/Urinary Tract: No masses identified. Several tiny less than
5 mm renal calculi are seen bilaterally. No evidence of ureteral
calculi or hydronephrosis.

Stomach/Bowel: No evidence of obstruction, inflammatory process or
abnormal fluid collections. Normal appendix visualized.

Vascular/Lymphatic: No pathologically enlarged lymph nodes. No
abdominal aortic aneurysm.

Reproductive: Prior hysterectomy noted. Adnexal regions are
unremarkable in appearance.

Other:  None.  No evidence of ventral abdominal wall hernia or mass.

Musculoskeletal:  No suspicious bone lesions identified.
IMPRESSION: No acute findings within the abdomen or pelvis.

Bilateral nephrolithiasis. No evidence of ureteral calculi or
hydronephrosis.

Mild hepatic steatosis.

## 2020-11-25 MED ORDER — IOHEXOL 300 MG/ML  SOLN
100.0000 mL | Freq: Once | INTRAMUSCULAR | Status: AC | PRN
  Administered 2020-11-25: 100 mL via INTRAVENOUS

## 2020-11-28 ENCOUNTER — Other Ambulatory Visit: Payer: Self-pay | Admitting: Nurse Practitioner

## 2020-11-28 DIAGNOSIS — R1013 Epigastric pain: Secondary | ICD-10-CM

## 2020-12-19 ENCOUNTER — Encounter (HOSPITAL_COMMUNITY)
Admission: RE | Admit: 2020-12-19 | Discharge: 2020-12-19 | Disposition: A | Discharge status: Home / Self Care | Payer: Managed Care, Other (non HMO) | Source: Ambulatory Visit | Attending: Nurse Practitioner | Admitting: Nurse Practitioner

## 2020-12-19 ENCOUNTER — Other Ambulatory Visit: Payer: Self-pay

## 2020-12-19 DIAGNOSIS — R1013 Epigastric pain: Secondary | ICD-10-CM | POA: Diagnosis present

## 2020-12-19 MED ORDER — TECHNETIUM TC 99M SULFUR COLLOID
1.9000 | Freq: Once | INTRAVENOUS | Status: AC | PRN
  Administered 2020-12-19: 1.9 via INTRAVENOUS

## 2020-12-23 NOTE — Telephone Encounter (Signed)
Kelly, pls contact the patient and let her know I will contact after Dr. Hilarie Fredrickson reviews her gastric empty study which was normal. CTAP unrevealing. Dr. Hilarie Fredrickson previously mentioned sending her to a general surgeon to consider hernia repair if her symptoms recurred. I would have her try Fdgard 2 tabs po bid purchased over the counter. If she is not currently enrolled in [facility] program I would encourage her to do so. Weight loss is recommended which may reduce her GI symptoms and also improve control of her diabetes. If she is agrees to Weight and Wellness consult, pls enter referral. Thx  Thank you.   Dr. Hilarie Fredrickson, refer to last office visit and gastric empty study notes. Let me know if you want patient to see a general surgeon for [facility] repair or if you have any other suggestions. Thx

## 2020-12-24 ENCOUNTER — Other Ambulatory Visit: Payer: Self-pay | Admitting: Nurse Practitioner

## 2020-12-24 DIAGNOSIS — R1013 Epigastric pain: Secondary | ICD-10-CM

## 2020-12-24 DIAGNOSIS — R1033 Periumbilical pain: Secondary | ICD-10-CM

## 2020-12-24 DIAGNOSIS — K449 Diaphragmatic hernia without obstruction or gangrene: Secondary | ICD-10-CM

## 2020-12-24 NOTE — Telephone Encounter (Signed)
Claiborne Billings and [place] Thank you both for your help and communication back with [name]. I have reviewed her recent CT scan as well as the gastric emptying scan which I see was normal.  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  We should continue to control her GERD, she has had esophagitis and so continue the twice daily pantoprazole is reasonable. Hiatal hernia repair may improve some of her symptoms but my fear is that this would have a high risk of recurrence without significant weight loss first. Certainly not wrong to meet with a surgeon to get their opinion regarding HH repair.  I would recommend Dr. Redmond Pulling at [facility]. Also very reasonable for her to see [facility]  If FD guard does not provide improvement then I would recommend trying Buspirone 10 mg twice daily --Please help let her know that this medication has been proven to be helpful in patients with functional dyspepsia.  When she researches this medication she will see that it is prescribed for anxiety.  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  I should see her in follow-up as well, next available Please let me know if she has questions and thanks again

## 2021-01-29 ENCOUNTER — Ambulatory Visit: Payer: Managed Care, Other (non HMO) | Admitting: Dietician

## 2021-02-09 ENCOUNTER — Other Ambulatory Visit (HOSPITAL_COMMUNITY): Payer: Self-pay | Admitting: General Surgery

## 2021-02-09 DIAGNOSIS — E669 Obesity, unspecified: Secondary | ICD-10-CM

## 2021-02-25 ENCOUNTER — Ambulatory Visit: Payer: Managed Care, Other (non HMO) | Admitting: Internal Medicine

## 2021-03-09 ENCOUNTER — Ambulatory Visit: Payer: Managed Care, Other (non HMO) | Admitting: Dietician

## 2021-04-29 ENCOUNTER — Other Ambulatory Visit: Payer: Self-pay

## 2021-04-29 ENCOUNTER — Encounter: Payer: Managed Care, Other (non HMO) | Attending: General Surgery | Admitting: Skilled Nursing Facility1

## 2021-04-29 ENCOUNTER — Encounter: Payer: Self-pay | Admitting: Skilled Nursing Facility1

## 2021-04-29 DIAGNOSIS — E119 Type 2 diabetes mellitus without complications: Secondary | ICD-10-CM

## 2021-04-29 NOTE — Progress Notes (Signed)
Nutrition Assessment for Bariatric Surgery Medical Nutrition Therapy Appt Start Time: 10:55    End Time: 11:50  Patient was seen on 04/29/2021 for Pre-Operative Nutrition Assessment. Letter of approval faxed to [facility] Surgery bariatric surgery program coordinator on 04/29/2021.   Referral stated Supervised Weight Loss (SWL) visits needed: 0  Pt completed visits.   Pt has cleared nutrition requirements.   Planned surgery: RYGB Pt expectation of surgery: to lose weight Pt expectation of dietitian: none stated    NUTRITION ASSESSMENT   Anthropometrics  Start weight at [facility]: 228.9 lbs (date: 04/29/2021)  Height: 63.5 in BMI: 39.91 kg/m2     Clinical  Medical hx: DM, GERD, hyperlipidemia, HTN, kidney stone Medications: farxiga, metformin   Labs:  Notable signs/symptoms: reflux Any previous deficiencies? Vitamin D  Micronutrient Nutrition Focused Physical Exam: Hair: No issues observed Eyes: No issues observed Mouth: No issues observed Neck: No issues observed Nails: No issues observed Skin: No issues observed  Lifestyle & Dietary Hx  Pt states she ate some watermelon and had a hive reaction so now unsure if she allergic to watermelon or sugar free natures twist lemonade she is unsure.   Pt states she has had diabetes for about 2 years and does not check her blood sugars and does not know what her A1C nor has she been educate don diabetes.  Pt states she works 2 jobs and does not have time to be active.  Pt state she is pretty picky with her non starchy vegetables but is working on trying more.   Pt states she is grieving the loss of her father and gives of herself constantly without refilling her own cup and would like to work on this prior to surgery: Dietitian advised she begin working with a Education officer, community and attend support group prior to surgery.   24-Hr Dietary Recall First Meal: 3 toast + butter Snack:  Second Meal: ham sandwich +  chips Snack: fruit Third Meal: skipped or pizza  Snack:  Beverages: water, deaf sweet tea, gingerale    Estimated Energy Needs Calories: 1500   NUTRITION DIAGNOSIS  Overweight/obesity ([place]-3.3) related to past poor dietary habits and physical inactivity as evidenced by patient w/ planned RYGB surgery following dietary guidelines for continued weight loss.    NUTRITION INTERVENTION  Nutrition counseling (C-1) and education (E-2) to facilitate bariatric surgery goals.  Educated pt on micronutrient deficiencies post surgery and strategies to mitigate that risk   Pre-Op Goals Reviewed with the Patient Track food and beverage intake (pen and paper, MyFitness Pal, Baritastic app, etc.) Make healthy food choices while monitoring portion sizes Consume 3 meals per day or try to eat every 3-5 hours Avoid concentrated sugars and fried foods Keep sugar & fat in the single digits per serving on food labels Practice CHEWING your food (aim for applesauce consistency) Practice not drinking 15 minutes before, during, and 30 minutes after each meal and snack Avoid all carbonated beverages (ex: soda, sparkling beverages)  Limit caffeinated beverages (ex: coffee, tea, energy drinks) Avoid all sugar-sweetened beverages (ex: regular soda, sports drinks)  Avoid alcohol  Aim for 64-100 ounces of FLUID daily (with at least half of fluid intake being plain water)  Aim for at least 60-80 grams of PROTEIN daily Look for a liquid protein source that contains ?15 g protein and ?5 g carbohydrate (ex: shakes, drinks, shots) Make a list of non-food related activities Physical activity is an important part of a healthy lifestyle so keep it moving!  The goal is to reach 150 minutes of exercise per week, including cardiovascular and weight baring activity.  *Goals that are bolded indicate the pt would like to start working towards these  Handouts Provided Include  Bariatric Surgery handouts (Nutrition Visits,  Pre-Op Goals, Protein Shakes, Vitamins & Minerals)  Learning Style & Readiness for Change Teaching method utilized: Visual & Auditory  Demonstrated degree of understanding via: Teach Back  Readiness Level: Action Barriers to learning/adherence to lifestyle change: None identified      MONITORING & EVALUATION Dietary intake, weekly physical activity, body weight, and pre-op goals reached at next nutrition visit.    Next Steps  Patient is to follow up at [facility] for Pre-Op Class >2 weeks before surgery for further nutrition education.   Pt has completed visits. No further supervised visits required/recomended

## 2021-06-17 ENCOUNTER — Other Ambulatory Visit: Payer: Self-pay | Admitting: Internal Medicine

## 2021-09-17 DIAGNOSIS — J069 Acute upper respiratory infection, unspecified: Secondary | ICD-10-CM | POA: Diagnosis not present

## 2021-09-17 DIAGNOSIS — Z20822 Contact with and (suspected) exposure to covid-19: Secondary | ICD-10-CM | POA: Diagnosis not present

## 2021-10-26 ENCOUNTER — Other Ambulatory Visit: Payer: Self-pay | Admitting: Nurse Practitioner

## 2021-10-29 DIAGNOSIS — Z2821 Immunization not carried out because of patient refusal: Secondary | ICD-10-CM | POA: Diagnosis not present

## 2021-10-29 DIAGNOSIS — Z01419 Encounter for gynecological examination (general) (routine) without abnormal findings: Secondary | ICD-10-CM | POA: Diagnosis not present

## 2021-10-29 DIAGNOSIS — Z1231 Encounter for screening mammogram for malignant neoplasm of breast: Secondary | ICD-10-CM | POA: Diagnosis not present

## 2021-10-29 DIAGNOSIS — Z719 Counseling, unspecified: Secondary | ICD-10-CM | POA: Diagnosis not present

## 2021-10-29 DIAGNOSIS — E669 Obesity, unspecified: Secondary | ICD-10-CM | POA: Diagnosis not present

## 2021-11-23 DIAGNOSIS — L03032 Cellulitis of left toe: Secondary | ICD-10-CM | POA: Diagnosis not present

## 2021-11-23 DIAGNOSIS — M79674 Pain in right toe(s): Secondary | ICD-10-CM | POA: Diagnosis not present

## 2021-11-23 DIAGNOSIS — M79675 Pain in left toe(s): Secondary | ICD-10-CM | POA: Diagnosis not present

## 2021-11-23 DIAGNOSIS — L03031 Cellulitis of right toe: Secondary | ICD-10-CM | POA: Diagnosis not present

## 2021-11-25 DIAGNOSIS — E1165 Type 2 diabetes mellitus with hyperglycemia: Secondary | ICD-10-CM | POA: Diagnosis not present

## 2021-11-25 DIAGNOSIS — I10 Essential (primary) hypertension: Secondary | ICD-10-CM | POA: Diagnosis not present

## 2021-11-25 DIAGNOSIS — Z79899 Other long term (current) drug therapy: Secondary | ICD-10-CM | POA: Diagnosis not present

## 2021-11-25 DIAGNOSIS — E559 Vitamin D deficiency, unspecified: Secondary | ICD-10-CM | POA: Diagnosis not present

## 2021-11-25 DIAGNOSIS — E119 Type 2 diabetes mellitus without complications: Secondary | ICD-10-CM | POA: Diagnosis not present

## 2021-11-25 DIAGNOSIS — Z0000 Encounter for general adult medical examination without abnormal findings: Secondary | ICD-10-CM | POA: Diagnosis not present

## 2021-12-08 ENCOUNTER — Other Ambulatory Visit: Payer: Self-pay | Admitting: Nurse Practitioner

## 2021-12-29 ENCOUNTER — Other Ambulatory Visit: Payer: Self-pay | Admitting: Nurse Practitioner

## 2022-01-20 ENCOUNTER — Other Ambulatory Visit: Payer: Self-pay | Admitting: Nurse Practitioner

## 2022-01-21 DIAGNOSIS — J3089 Other allergic rhinitis: Secondary | ICD-10-CM | POA: Diagnosis not present

## 2022-01-21 DIAGNOSIS — J301 Allergic rhinitis due to pollen: Secondary | ICD-10-CM | POA: Diagnosis not present

## 2022-01-21 DIAGNOSIS — J45909 Unspecified asthma, uncomplicated: Secondary | ICD-10-CM | POA: Diagnosis not present

## 2022-01-30 DIAGNOSIS — E109 Type 1 diabetes mellitus without complications: Secondary | ICD-10-CM | POA: Diagnosis not present

## 2022-02-11 ENCOUNTER — Encounter: Payer: Self-pay | Admitting: Physician Assistant

## 2022-02-11 ENCOUNTER — Ambulatory Visit (INDEPENDENT_AMBULATORY_CARE_PROVIDER_SITE_OTHER): Payer: BC Managed Care – PPO | Admitting: Physician Assistant

## 2022-02-11 VITALS — BP 118/78 | HR 93 | Ht 64.0 in | Wt 241.2 lb

## 2022-02-11 DIAGNOSIS — Z860100 Personal history of colon polyps, unspecified: Secondary | ICD-10-CM

## 2022-02-11 DIAGNOSIS — R1310 Dysphagia, unspecified: Secondary | ICD-10-CM | POA: Diagnosis not present

## 2022-02-11 DIAGNOSIS — K449 Diaphragmatic hernia without obstruction or gangrene: Secondary | ICD-10-CM | POA: Diagnosis not present

## 2022-02-11 DIAGNOSIS — K219 Gastro-esophageal reflux disease without esophagitis: Secondary | ICD-10-CM

## 2022-02-11 MED ORDER — PANTOPRAZOLE SODIUM 40 MG PO TBEC: 40.0000 mg | DELAYED_RELEASE_TABLET | Freq: Two times a day (BID) | ORAL | 3 refills | Status: DC

## 2022-02-11 MED ORDER — BUSPIRONE HCL 10 MG PO TABS: 10.0000 mg | ORAL_TABLET | Freq: Two times a day (BID) | ORAL | 8 refills | Status: AC

## 2022-02-11 MED ORDER — FAMOTIDINE 40 MG PO TABS: 40.0000 mg | ORAL_TABLET | Freq: Every day | ORAL | 1 refills | Status: DC

## 2022-02-11 NOTE — Progress Notes (Signed)
? ?Subjective:  ? ? Patient ID: Ann Porter, female    DOB: [birth year], 46 y.o.   MRN: [id number] ? ?HPI ?Ann Porter is a pleasant 46 year old white female, established with Dr. Hilarie Fredrickson who comes in today for follow-up, and medication refills. ?She was last seen in the office in January 2023 by Carl Best, NP with complaints of epigastric burning, and ongoing symptoms of heartburn, acid reflux, upper abdominal fullness, bloating and early satiety symptoms. ?She had undergone EGD last in September 2021 which showed improved reflux esophagitis, she was empirically dilated at that time due to complaints of dysphagia.  She had had colonoscopy in June 2021 with removal of 4 polyps, largest 6 mm and also noted to have descending and sigmoid tics.  Biopsies showed 2 sessile serrated polyps and 2 hyperplastic polyps and is indicated for 5-year interval follow-up. ? ?She was scheduled for gastric emptying scan after the last office visit, and was continued on Protonix 40 mg twice daily and famotidine 40 mg at bedtime. ?Gastric emptying scan was normal. ?She had also had CT of the abdomen pelvis in January 2023 which showed hepatic steatosis, normal-appearing gallbladder, bilateral nephrolithiasis and was otherwise negative. ? ?Patient has previously documented 3 cm hiatal hernia.  She says today that despite taking twice daily PPI and Pepcid she has daily "horrible" acid reflux symptoms, ongoing heartburn and episodes of reflux/sour brash.  Symptoms are usually best in the morning and worse as the day goes on.  She also has ongoing chronic symptoms with upper abdominal fullness bloating and sense of distention.  She says she feels like everything sits in her upper abdomen, this is generally worse as the day goes on as well. ?She would like a referral back to [facility] surgery/Dr. Redmond Pulling whom she saw within the past couple of years for possible hiatal hernia repair and gastric bypass surgery.  She says that she  has had a lot of family issues since that time, her husband had been out of work and she would have been unable to proceed with any surgery during that time.  She has lost about 10 people close to her over the past year and a half as well.  She is finally getting back on track and would like to pursue the hiatal hernia repair.  She is not sure about gastric bypass surgery but would like to discuss further.  She has spoken to her PCP about alternative weight management options i.e. Ozempic and hopes to start that soon. ? ? ?Review of Systems.Pertinent positive and negative review of systems were noted in the above HPI section.  All other review of systems was otherwise negative.  ? ?Outpatient Encounter Medications as of 02/11/2022  ?Medication Sig  ? busPIRone (BUSPAR) 10 MG tablet Take 1 tablet (10 mg total) by mouth 2 (two) times daily.  ? ezetimibe (ZETIA) 10 MG tablet Take 10 mg by mouth daily.  ? FARXIGA 10 MG TABS tablet Take 10 mg by mouth daily.  ? hydrochlorothiazide (HYDRODIURIL) 12.5 MG tablet Take 12.5 mg by mouth daily.  ? levocetirizine (XYZAL) 5 MG tablet Take 5 mg by mouth daily.  ? lisinopril (ZESTRIL) 40 MG tablet Take 40 mg by mouth daily.  ? metFORMIN (GLUCOPHAGE) 500 MG tablet Take 500 mg by mouth daily.  ? [DISCONTINUED] famotidine (PEPCID) 40 MG tablet TAKE 1 TABLET BY MOUTH AT BEDTIME  ? [DISCONTINUED] pantoprazole (PROTONIX) 40 MG tablet Take 1 tablet (40 mg total) by mouth 2 (two) times daily before  a meal. NO FURTHER REFILLS WITHOUT APPOINTMENT.  ? famotidine (PEPCID) 40 MG tablet Take 1 tablet (40 mg total) by mouth at bedtime.  ? pantoprazole (PROTONIX) 40 MG tablet Take 1 tablet (40 mg total) by mouth 2 (two) times daily before a meal.  ? [DISCONTINUED] atorvastatin (LIPITOR) 40 MG tablet Take 40 mg by mouth daily.  ? ?No facility-administered encounter medications on file as of 02/11/2022.  ? ?Allergies  ?Allergen Reactions  ? Sulfa Antibiotics Hives  ? ?Patient Active Problem List  ?  Diagnosis Date Noted  ? Gastroesophageal reflux disease 04/15/2020  ? 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 Throat clearing 02/29/2020  ? Abdominal pain, epigastric 02/29/2020  ? Heartburn 02/29/2020  ? Steatosis of liver 12/12/2019  ? Hyperglycemia due to type 2 diabetes mellitus ([place]) 05/02/2019  ? Type 2 diabetes mellitus without complication ([place]) 92/09/9416  ? Benign essential hypertension 04/19/2019  ? Plantar fasciitis of left foot 02/13/2018  ? Short Achilles tendon 02/13/2018  ? Morbid obesity ([place]) 10/17/2017  ? Hyperlipidemia 11/09/2016  ? Kidney stone 04/28/2016  ? Vitamin D deficiency 04/28/2016  ? IIH (idiopathic intracranial hypertension) 01/10/2014  ? ?Social History  ? ?Socioeconomic History  ? Marital status: Married  ?  Spouse name: Not on file  ? Number of children: 2  ? Years of education: Not on file  ? Highest education level: Not on file  ?Occupational History  ? Not on file  ?Tobacco Use  ? Smoking status: Never  ? Smokeless tobacco: Never  ?Vaping Use  ? Vaping Use: Never used  ?Substance and Sexual Activity  ? Alcohol use: Not Currently  ? Drug use: Not Currently  ? Sexual activity: Not on file  ?Other Topics Concern  ? Not on file  ?Social History Narrative  ? Lives at home spouse and 2 children  ? Right handed  ? Caffeine: maybe 1 cup/day  ? ?Social Determinants of Health  ? ?Financial Resource Strain: Not on file  ?Food Insecurity: Not on file  ?Transportation Needs: Not on file  ?Physical Activity: Not on file  ?Stress: Not on file  ?Social Connections: Not on file  ?Intimate Partner Violence: Not on file  ? ? ?Ms. Azeez's family history includes Alcohol abuse in her father; Diabetes in her father; Elevated Lipids in her mother; Gout in her father and mother; Heart disease in her father and mother; Hyperlipidemia in her father; Hypertension in her father and mother; Macular degeneration in an other family member; Parkinson's disease in her father; Sleep apnea in her  father; Strabismus in an other family member. ? ? ?   ?Objective:  ?  ?Vitals:  ? 02/11/22 1432  ?BP: 118/78  ?Pulse: 93  ?SpO2: 96%  ? ? ?Physical Exam Well-developed well-nourished WF in no acute distress.   Weight 241 BMI41 ? ?HEENT; nontraumatic normocephalic, EOMI, PE R LA, sclera anicteric. ?Oropharynx; not examined today ? ?Extremities; no clubbing cyanosis or edema skin warm and dry ?Neuro/Psych; alert and oriented x4, grossly nonfocal mood and affect appropriate  ? ? ? ?   ?Assessment & Plan:  ? ?#57 46 year old white female with chronic refractory GERD, symptoms primarily heartburn indigestion and reflux with sour brash.  Patient with previously documented 3 cm hiatal hernia ? ?Persistent daily symptoms despite twice daily PPI and every afternoon famotidine 40 mg ? ?#2 upper abdominal bloating fullness, some early satiety and distention-recent negative CT and normal gastric emptying scan. 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 ?#3  morbid obesity-BMI 41 ?#4 adult onset diabetes mellitus ?#5 nephrolithiasis ?#6.  Hepatic steatosis ?#7.  Hypertension ?#8.  History of sessile serrated and hyperplastic polyps-up-to-date with colonoscopy due for follow-up June 2026 ? ?Plan; patient will be referred to [facility] surgery/Dr. Redmond Pulling to discuss hiatal hernia repair, and possible weight loss surgery ?I encouraged her to be to her PCP about starting Ozempic or other similar agent ?Continue Protonix 40 mg p.o. twice daily AC breakfast and AC dinner ?Continue Pepcid 40 mg every afternoon ?Strict antireflux regimen was reviewed, including n.p.o. for 2 to 3 hours prior to bedtime, elevation of the back to 45 degrees for sleep.  She was given an antireflux diet ?We will start trial of BuSpar 10 mg p.o. twice daily for functional dyspepsia which hopefully will help with gastric accommodation ?Have asked her to call back in 2 to 3 weeks and give [place] an update regarding effectiveness of BuSpar. ? ? ? ?Hutson Luft S  Darneshia Demary PA-C ?02/11/2022 ? ? ?Cc: Pomposini, Cherly Anderson, MD ?  ?

## 2022-02-11 NOTE — Patient Instructions (Addendum)
If you are age 47 or younger, your body mass index should be between 19-25. Your Body mass index is 41.41 kg/m?Marland Kitchen If this is out of the aformentioned range listed, please consider follow up with your Primary Care Provider.  ?________________________________________________________ ? ?The  GI providers would like to encourage you to use [facility] to communicate with providers for non-urgent requests or questions.  Due to long hold times on the telephone, sending your provider a message by [facility] may be a faster and more efficient way to get a response.  Please allow 48 business hours for a response.  Please remember that this is for non-urgent requests.  ?_______________________________________________________ ? ?Continue Pantoprazole and Famotidine ? ?START Buspar 10 mg twice daily ? ?Call the office in 2-3 weeks and let Amy's nurse, Beth, know how you are doing with the Buspar. ? ?Follow a strict anti-reflux diet ? ?We will be referring you to [facility]. ? ?Thank you for entrusting me with your care and choosing [facility]. ? ?Amy Esterwood, PA-C ? ? ?

## 2022-02-19 NOTE — Progress Notes (Signed)
Addendum: Reviewed and agree with assessment and management plan. Reese Senk M, MD  

## 2022-03-01 DIAGNOSIS — E109 Type 1 diabetes mellitus without complications: Secondary | ICD-10-CM | POA: Diagnosis not present

## 2022-03-10 DIAGNOSIS — L570 Actinic keratosis: Secondary | ICD-10-CM | POA: Diagnosis not present

## 2022-03-10 DIAGNOSIS — L814 Other melanin hyperpigmentation: Secondary | ICD-10-CM | POA: Diagnosis not present

## 2022-03-10 DIAGNOSIS — L578 Other skin changes due to chronic exposure to nonionizing radiation: Secondary | ICD-10-CM | POA: Diagnosis not present

## 2022-03-10 DIAGNOSIS — L918 Other hypertrophic disorders of the skin: Secondary | ICD-10-CM | POA: Diagnosis not present

## 2022-03-10 DIAGNOSIS — L448 Other specified papulosquamous disorders: Secondary | ICD-10-CM | POA: Diagnosis not present

## 2022-03-11 DIAGNOSIS — K449 Diaphragmatic hernia without obstruction or gangrene: Secondary | ICD-10-CM | POA: Diagnosis not present

## 2022-03-11 DIAGNOSIS — K219 Gastro-esophageal reflux disease without esophagitis: Secondary | ICD-10-CM | POA: Diagnosis not present

## 2022-03-19 ENCOUNTER — Other Ambulatory Visit: Payer: Self-pay | Admitting: General Surgery

## 2022-03-19 DIAGNOSIS — K219 Gastro-esophageal reflux disease without esophagitis: Secondary | ICD-10-CM

## 2022-04-01 DIAGNOSIS — E109 Type 1 diabetes mellitus without complications: Secondary | ICD-10-CM | POA: Diagnosis not present

## 2022-04-14 DIAGNOSIS — K2100 Gastro-esophageal reflux disease with esophagitis, without bleeding: Secondary | ICD-10-CM | POA: Diagnosis not present

## 2022-04-14 DIAGNOSIS — E1165 Type 2 diabetes mellitus with hyperglycemia: Secondary | ICD-10-CM | POA: Diagnosis not present

## 2022-04-14 DIAGNOSIS — E782 Mixed hyperlipidemia: Secondary | ICD-10-CM | POA: Diagnosis not present

## 2022-04-14 DIAGNOSIS — I10 Essential (primary) hypertension: Secondary | ICD-10-CM | POA: Diagnosis not present

## 2022-04-20 NOTE — Progress Notes (Addendum)
Anesthesia Review:  PCP: Ann Porter in [place]  Cardiologist : none  Chest x-ray : EKG : 04/26/22  Echo : Stress test: Cardiac Cath :  Activity level:  can do a flight of stairs without difficulty  Sleep Study/ CPAP : none  Fasting Blood Sugar :      / Checks Blood Sugar -- times a day:   Blood Thinner/ Instructions /Last Dose: ASA / Instructions/ Last Dose :   DM- type 2- checks glucose once per week at home  Hgba1c-  04/26/22- 6.5  Blood pressure at time of preo was 147/102.  Pt denies any chest pain, shortness of breath, dizziness or headache.  PT checks blood pressure at home.  PT aware to monitor blood pressure at home .

## 2022-04-21 ENCOUNTER — Ambulatory Visit: Payer: Self-pay | Admitting: General Surgery

## 2022-04-21 NOTE — Progress Notes (Addendum)
DUE TO COVID-19 ONLY  2 VISITOR IS ALLOWED TO COME WITH YOU AND STAY IN THE WAITING ROOM ONLY DURING PRE OP AND PROCEDURE DAY OF SURGERY.   4  VISITOR  MAY VISIT WITH YOU AFTER SURGERY IN YOUR PRIVATE ROOM DURING VISITING HOURS ONLY! YOU MAY HAVE ONE PERSON SPEND THE NITE WITH YOU IN YOUR ROOM AFTER SURGERY.       Your procedure is scheduled on:            05/10/2022   Report to [facility] Main  Entrance   Report to admitting at       0845           AM DO NOT BRING INSURANCE CARD, PICTURE ID OR WALLET DAY OF SURGERY.      Call this number if you have problems the morning of surgery [phone]    REMEMBER: NO  SOLID FOODS , CANDY, GUM OR MINTS AFTER MIDNITE THE NITE BEFORE SURGERY .       Marland Kitchen CLEAR LIQUIDS UNTIL        0745am         DAY OF SURGERY.  Complete  g 2 lower sugar presurgery drink at 0745am morning of surgery,     CLEAR LIQUID DIET   Foods Allowed      WATER BLACK COFFEE ( SUGAR OK, NO MILK, CREAM OR CREAMER) REGULAR AND DECAF  TEA ( SUGAR OK NO MILK, CREAM, OR CREAMER) REGULAR AND DECAF  PLAIN JELLO ( NO RED)  FRUIT ICES ( NO RED, NO FRUIT PULP)  POPSICLES ( NO RED)  JUICE- APPLE, WHITE GRAPE AND WHITE CRANBERRY  SPORT DRINK LIKE GATORADE ( NO RED)  CLEAR BROTH ( VEGETABLE , CHICKEN OR BEEF)                                                                     BRUSH YOUR TEETH MORNING OF SURGERY AND RINSE YOUR MOUTH OUT, NO CHEWING GUM CANDY OR MINTS.     Take these medicines the morning of surgery with A SIP OF WATER:  buspar, protonix, xyzal,    DO NOT TAKE ANY DIABETIC MEDICATIONS DAY OF YOUR SURGERY                               You may not have any metal on your body including hair pins and              piercings  Do not wear jewelry, make-up, lotions, powders or perfumes, deodorant             Do not wear nail polish on your fingernails.              IF YOU ARE A FEMALE AND WANT TO SHAVE UNDER ARMS OR LEGS PRIOR TO SURGERY YOU MUST DO SO AT LEAST  48 HOURS PRIOR TO SURGERY.              Men may shave face and neck.   Do not bring valuables to the hospital. [facility] IS NOT             RESPONSIBLE   FOR VALUABLES.  Contacts, dentures or  bridgework may not be worn into surgery.  Leave suitcase in the car. After surgery it may be brought to your room.     Patients discharged the day of surgery will not be allowed to drive home. IF YOU ARE HAVING SURGERY AND GOING HOME THE SAME DAY, YOU MUST HAVE AN ADULT TO DRIVE YOU HOME AND BE WITH YOU FOR 24 HOURS. YOU MAY GO HOME BY TAXI OR UBER OR ORTHERWISE, BUT AN ADULT MUST ACCOMPANY YOU HOME AND STAY WITH YOU FOR 24 HOURS.                Please read over the following fact sheets you were given: _____________________________________________________________________  [facility] - Preparing for Surgery Before surgery, you can play an important role.  Because skin is not sterile, your skin needs to be as free of germs as possible.  You can reduce the number of germs on your skin by washing with CHG (chlorahexidine gluconate) soap before surgery.  CHG is an antiseptic cleaner which kills germs and bonds with the skin to continue killing germs even after washing. Please DO NOT use if you have an allergy to CHG or antibacterial soaps.  If your skin becomes reddened/irritated stop using the CHG and inform your nurse when you arrive at Short Stay. Do not shave (including legs and underarms) for at least 48 hours prior to the first CHG shower.  You may shave your face/neck. Please follow these instructions carefully:  1.  Shower with CHG Soap the night before surgery and the  morning of Surgery.  2.  If you choose to wash your hair, wash your hair first as usual with your  normal  shampoo.  3.  After you shampoo, rinse your hair and body thoroughly to remove the  shampoo.                           4.  Use CHG as you would any other liquid soap.  You can apply chg directly  to the skin and wash                        Gently with a scrungie or clean washcloth.  5.  Apply the CHG Soap to your body ONLY FROM THE NECK DOWN.   Do not use on face/ open                           Wound or open sores. Avoid contact with eyes, ears mouth and genitals (private parts).                       Wash face,  Genitals (private parts) with your normal soap.             6.  Wash thoroughly, paying special attention to the area where your surgery  will be performed.  7.  Thoroughly rinse your body with warm water from the neck down.  8.  DO NOT shower/wash with your normal soap after using and rinsing off  the CHG Soap.                9.  Pat yourself dry with a clean towel.            10.  Wear clean pajamas.            11.  Place clean sheets  on your bed the night of your first shower and do not  sleep with pets. Day of Surgery : Do not apply any lotions/deodorants the morning of surgery.  Please wear clean clothes to the hospital/surgery center.  FAILURE TO FOLLOW THESE INSTRUCTIONS MAY RESULT IN THE CANCELLATION OF YOUR SURGERY PATIENT SIGNATURE_________________________________  NURSE SIGNATURE__________________________________  ________________________________________________________________________

## 2022-04-26 ENCOUNTER — Other Ambulatory Visit: Payer: Self-pay

## 2022-04-26 ENCOUNTER — Encounter (HOSPITAL_COMMUNITY): Payer: Self-pay

## 2022-04-26 ENCOUNTER — Ambulatory Visit
Admission: RE | Admit: 2022-04-26 | Discharge: 2022-04-26 | Disposition: A | Discharge status: Home / Self Care | Payer: BC Managed Care – PPO | Source: Ambulatory Visit | Attending: General Surgery | Admitting: General Surgery

## 2022-04-26 ENCOUNTER — Encounter (HOSPITAL_COMMUNITY)
Admission: RE | Admit: 2022-04-26 | Discharge: 2022-04-26 | Disposition: A | Discharge status: Home / Self Care | Payer: BC Managed Care – PPO | Source: Ambulatory Visit | Attending: General Surgery | Admitting: General Surgery

## 2022-04-26 VITALS — BP 147/102 | HR 77 | Temp 98.0°F | Resp 16 | Ht 64.0 in | Wt 239.0 lb

## 2022-04-26 DIAGNOSIS — Z01818 Encounter for other preprocedural examination: Secondary | ICD-10-CM | POA: Insufficient documentation

## 2022-04-26 DIAGNOSIS — K449 Diaphragmatic hernia without obstruction or gangrene: Secondary | ICD-10-CM | POA: Diagnosis not present

## 2022-04-26 DIAGNOSIS — K219 Gastro-esophageal reflux disease without esophagitis: Secondary | ICD-10-CM

## 2022-04-26 DIAGNOSIS — I498 Other specified cardiac arrhythmias: Secondary | ICD-10-CM | POA: Insufficient documentation

## 2022-04-26 HISTORY — DX: Personal history of urinary calculi: Z87.442

## 2022-04-26 HISTORY — DX: Headache, unspecified: R51.9

## 2022-04-26 LAB — BASIC METABOLIC PANEL
Anion gap: 8 (ref 5–15)
BUN: 10 mg/dL (ref 6–20)
CO2: 25 mmol/L (ref 22–32)
Calcium: 9.3 mg/dL (ref 8.9–10.3)
Chloride: 105 mmol/L (ref 98–111)
Creatinine, Ser: 0.79 mg/dL (ref 0.44–1.00)
GFR, Estimated: >60 mL/min (ref >60)
Glucose, Bld: 127 mg/dL — ABNORMAL HIGH (ref 70–99)
Potassium: 4 mmol/L (ref 3.5–5.1)
Sodium: 138 mmol/L (ref 135–145)

## 2022-04-26 LAB — CBC
HCT: 43.8 % (ref 36.0–46.0)
Hemoglobin: 14.9 g/dL (ref 12.0–15.0)
MCH: 31.4 pg (ref 26.0–34.0)
MCHC: 34 g/dL (ref 30.0–36.0)
MCV: 92.2 fL (ref 80.0–100.0)
Platelets: 279 10*3/uL (ref 150–400)
RBC: 4.75 MIL/uL (ref 3.87–5.11)
RDW: 12.1 % (ref 11.5–15.5)
WBC: 6.6 10*3/uL (ref 4.0–10.5)
nRBC: 0 % (ref 0.0–0.2)

## 2022-04-26 LAB — GLUCOSE, CAPILLARY: Glucose-Capillary: 129 mg/dL — ABNORMAL HIGH (ref 70–99)

## 2022-04-27 LAB — HEMOGLOBIN A1C
Hgb A1c MFr Bld: 6.5 % — ABNORMAL HIGH (ref 4.8–5.6)
Mean Plasma Glucose: 140 mg/dL

## 2022-04-28 ENCOUNTER — Other Ambulatory Visit: Payer: Self-pay | Admitting: Physician Assistant

## 2022-05-01 DIAGNOSIS — E109 Type 1 diabetes mellitus without complications: Secondary | ICD-10-CM | POA: Diagnosis not present

## 2022-05-10 ENCOUNTER — Observation Stay (HOSPITAL_COMMUNITY): Payer: BC Managed Care – PPO

## 2022-05-10 ENCOUNTER — Observation Stay (HOSPITAL_COMMUNITY)
Admission: RE | Admit: 2022-05-10 | Discharge: 2022-05-11 | Disposition: A | Discharge status: Home / Self Care | Payer: BC Managed Care – PPO | Attending: General Surgery | Admitting: General Surgery

## 2022-05-10 ENCOUNTER — Other Ambulatory Visit: Payer: Self-pay

## 2022-05-10 ENCOUNTER — Ambulatory Visit (HOSPITAL_COMMUNITY): Payer: BC Managed Care – PPO | Admitting: Certified Registered Nurse Anesthetist

## 2022-05-10 ENCOUNTER — Ambulatory Visit (HOSPITAL_COMMUNITY): Payer: BC Managed Care – PPO | Admitting: Physician Assistant

## 2022-05-10 ENCOUNTER — Encounter (HOSPITAL_COMMUNITY)
Admission: RE | Disposition: A | Discharge status: Home / Self Care | Payer: Self-pay | Source: Home / Self Care | Attending: General Surgery

## 2022-05-10 ENCOUNTER — Encounter (HOSPITAL_COMMUNITY): Payer: Self-pay | Admitting: General Surgery

## 2022-05-10 DIAGNOSIS — E119 Type 2 diabetes mellitus without complications: Secondary | ICD-10-CM | POA: Diagnosis not present

## 2022-05-10 DIAGNOSIS — N289 Disorder of kidney and ureter, unspecified: Secondary | ICD-10-CM | POA: Diagnosis not present

## 2022-05-10 DIAGNOSIS — K219 Gastro-esophageal reflux disease without esophagitis: Secondary | ICD-10-CM | POA: Diagnosis not present

## 2022-05-10 DIAGNOSIS — Z01818 Encounter for other preprocedural examination: Secondary | ICD-10-CM

## 2022-05-10 DIAGNOSIS — Z79899 Other long term (current) drug therapy: Secondary | ICD-10-CM | POA: Insufficient documentation

## 2022-05-10 DIAGNOSIS — K449 Diaphragmatic hernia without obstruction or gangrene: Secondary | ICD-10-CM | POA: Diagnosis not present

## 2022-05-10 DIAGNOSIS — I10 Essential (primary) hypertension: Secondary | ICD-10-CM | POA: Insufficient documentation

## 2022-05-10 DIAGNOSIS — R0600 Dyspnea, unspecified: Secondary | ICD-10-CM | POA: Diagnosis not present

## 2022-05-10 HISTORY — PX: XI ROBOTIC ASSISTED HIATAL HERNIA REPAIR: SHX6889

## 2022-05-10 LAB — CREATININE, SERUM
Creatinine, Ser: 0.91 mg/dL (ref 0.44–1.00)
GFR, Estimated: >60 mL/min (ref >60)

## 2022-05-10 LAB — CBC
HCT: 45.7 % (ref 36.0–46.0)
Hemoglobin: 16 g/dL — ABNORMAL HIGH (ref 12.0–15.0)
MCH: 31.6 pg (ref 26.0–34.0)
MCHC: 35 g/dL (ref 30.0–36.0)
MCV: 90.3 fL (ref 80.0–100.0)
Platelets: 238 10*3/uL (ref 150–400)
RBC: 5.06 MIL/uL (ref 3.87–5.11)
RDW: 12.3 % (ref 11.5–15.5)
WBC: 18.4 10*3/uL — ABNORMAL HIGH (ref 4.0–10.5)
nRBC: 0 % (ref 0.0–0.2)

## 2022-05-10 LAB — GLUCOSE, CAPILLARY
Glucose-Capillary: 142 mg/dL — ABNORMAL HIGH (ref 70–99)
Glucose-Capillary: 201 mg/dL — ABNORMAL HIGH (ref 70–99)
Glucose-Capillary: 162 mg/dL — ABNORMAL HIGH (ref 70–99)

## 2022-05-10 SURGERY — REPAIR, HERNIA, HIATAL, ROBOT-ASSISTED
Anesthesia: General

## 2022-05-10 MED ORDER — ONDANSETRON 4 MG PO TBDP: 4.0000 mg | ORAL_TABLET | Freq: Four times a day (QID) | ORAL | Status: DC | PRN

## 2022-05-10 MED ORDER — DEXAMETHASONE SODIUM PHOSPHATE 10 MG/ML IJ SOLN
INTRAMUSCULAR | Status: DC | PRN
  Administered 2022-05-10: 10 mg via INTRAVENOUS

## 2022-05-10 MED ORDER — ALBUTEROL SULFATE (2.5 MG/3ML) 0.083% IN NEBU
2.5000 mg | INHALATION_SOLUTION | Freq: Four times a day (QID) | RESPIRATORY_TRACT | Status: DC | PRN
  Administered 2022-05-10: 2.5 mg via RESPIRATORY_TRACT

## 2022-05-10 MED ORDER — BUPIVACAINE-EPINEPHRINE 0.25% -1:200000 IJ SOLN
INTRAMUSCULAR | Status: DC | PRN
  Administered 2022-05-10: 30 mL

## 2022-05-10 MED ORDER — DIPHENHYDRAMINE HCL 50 MG/ML IJ SOLN: 25.0000 mg | Freq: Four times a day (QID) | INTRAMUSCULAR | Status: DC | PRN

## 2022-05-10 MED ORDER — ACETAMINOPHEN 500 MG PO TABS
1000.0000 mg | ORAL_TABLET | ORAL | Status: AC
  Administered 2022-05-10: 1000 mg via ORAL

## 2022-05-10 MED ORDER — LACTATED RINGERS IV SOLN: INTRAVENOUS | Status: DC

## 2022-05-10 MED ORDER — SUGAMMADEX SODIUM 500 MG/5ML IV SOLN
INTRAVENOUS | Status: AC
  Filled 2022-05-10: qty 5

## 2022-05-10 MED ORDER — OXYCODONE HCL 5 MG PO TABS
5.0000 mg | ORAL_TABLET | ORAL | Status: DC | PRN
  Administered 2022-05-10 – 2022-05-11 (×3): 10 mg via ORAL
  Filled 2022-05-10 (×3): qty 2

## 2022-05-10 MED ORDER — ONDANSETRON HCL 4 MG/2ML IJ SOLN: 4.0000 mg | Freq: Four times a day (QID) | INTRAMUSCULAR | Status: DC | PRN

## 2022-05-10 MED ORDER — ROCURONIUM BROMIDE 10 MG/ML (PF) SYRINGE
PREFILLED_SYRINGE | INTRAVENOUS | Status: DC | PRN
  Administered 2022-05-10 (×2): 10 mg via INTRAVENOUS
  Administered 2022-05-10: 50 mg via INTRAVENOUS
  Administered 2022-05-10: 20 mg via INTRAVENOUS

## 2022-05-10 MED ORDER — ENOXAPARIN SODIUM 40 MG/0.4ML IJ SOSY
40.0000 mg | PREFILLED_SYRINGE | INTRAMUSCULAR | Status: DC
  Administered 2022-05-11: 40 mg via SUBCUTANEOUS
  Filled 2022-05-10: qty 0.4

## 2022-05-10 MED ORDER — FENTANYL CITRATE (PF) 100 MCG/2ML IJ SOLN
INTRAMUSCULAR | Status: AC
  Filled 2022-05-10: qty 2

## 2022-05-10 MED ORDER — LORATADINE 10 MG PO TABS
10.0000 mg | ORAL_TABLET | Freq: Every evening | ORAL | Status: DC
  Administered 2022-05-10: 10 mg via ORAL
  Filled 2022-05-10: qty 1

## 2022-05-10 MED ORDER — PROPOFOL 10 MG/ML IV BOLUS
INTRAVENOUS | Status: AC
  Filled 2022-05-10: qty 20

## 2022-05-10 MED ORDER — MIDAZOLAM HCL 2 MG/2ML IJ SOLN
INTRAMUSCULAR | Status: DC | PRN
  Administered 2022-05-10: 2 mg via INTRAVENOUS

## 2022-05-10 MED ORDER — FENTANYL CITRATE (PF) 250 MCG/5ML IJ SOLN
INTRAMUSCULAR | Status: AC
  Filled 2022-05-10: qty 5

## 2022-05-10 MED ORDER — DEXAMETHASONE SODIUM PHOSPHATE 10 MG/ML IJ SOLN
INTRAMUSCULAR | Status: AC
Start: 2022-05-10 — End: ?
  Filled 2022-05-10: qty 1

## 2022-05-10 MED ORDER — ONDANSETRON HCL 4 MG/2ML IJ SOLN
INTRAMUSCULAR | Status: DC | PRN
  Administered 2022-05-10: 4 mg via INTRAVENOUS

## 2022-05-10 MED ORDER — DIPHENHYDRAMINE HCL 25 MG PO CAPS: 25.0000 mg | ORAL_CAPSULE | Freq: Four times a day (QID) | ORAL | Status: DC | PRN

## 2022-05-10 MED ORDER — PROPOFOL 10 MG/ML IV BOLUS
INTRAVENOUS | Status: DC | PRN
  Administered 2022-05-10: 50 mg via INTRAVENOUS

## 2022-05-10 MED ORDER — BUPIVACAINE LIPOSOME 1.3 % IJ SUSP
INTRAMUSCULAR | Status: DC | PRN
  Administered 2022-05-10: 20 mL

## 2022-05-10 MED ORDER — CHLORHEXIDINE GLUCONATE CLOTH 2 % EX PADS: 6.0000 | MEDICATED_PAD | Freq: Once | CUTANEOUS | Status: DC

## 2022-05-10 MED ORDER — TRAMADOL HCL 50 MG PO TABS
50.0000 mg | ORAL_TABLET | Freq: Four times a day (QID) | ORAL | Status: DC | PRN
  Administered 2022-05-11: 50 mg via ORAL
  Filled 2022-05-10: qty 1

## 2022-05-10 MED ORDER — SUGAMMADEX SODIUM 500 MG/5ML IV SOLN
INTRAVENOUS | Status: DC | PRN
  Administered 2022-05-10: 250 mg via INTRAVENOUS

## 2022-05-10 MED ORDER — SCOPOLAMINE 1 MG/3DAYS TD PT72
MEDICATED_PATCH | TRANSDERMAL | Status: AC
  Filled 2022-05-10: qty 1

## 2022-05-10 MED ORDER — SUCCINYLCHOLINE CHLORIDE 200 MG/10ML IV SOSY
PREFILLED_SYRINGE | INTRAVENOUS | Status: DC | PRN
  Administered 2022-05-10: 140 mg via INTRAVENOUS

## 2022-05-10 MED ORDER — SIMETHICONE 80 MG PO CHEW: 40.0000 mg | CHEWABLE_TABLET | Freq: Four times a day (QID) | ORAL | Status: DC | PRN

## 2022-05-10 MED ORDER — HYDROMORPHONE HCL 1 MG/ML IJ SOLN
0.2500 mg | INTRAMUSCULAR | Status: DC | PRN
  Administered 2022-05-10 (×2): 0.5 mg via INTRAVENOUS

## 2022-05-10 MED ORDER — PHENYLEPHRINE HCL (PRESSORS) 10 MG/ML IV SOLN
INTRAVENOUS | Status: AC
  Filled 2022-05-10: qty 1

## 2022-05-10 MED ORDER — LACTATED RINGERS IV SOLN: INTRAVENOUS | Status: DC | PRN

## 2022-05-10 MED ORDER — BUPIVACAINE-EPINEPHRINE (PF) 0.25% -1:200000 IJ SOLN
INTRAMUSCULAR | Status: AC
  Filled 2022-05-10: qty 30

## 2022-05-10 MED ORDER — ROCURONIUM BROMIDE 10 MG/ML (PF) SYRINGE
PREFILLED_SYRINGE | INTRAVENOUS | Status: AC
  Filled 2022-05-10: qty 10

## 2022-05-10 MED ORDER — SUCCINYLCHOLINE CHLORIDE 200 MG/10ML IV SOSY
PREFILLED_SYRINGE | INTRAVENOUS | Status: AC
  Filled 2022-05-10: qty 10

## 2022-05-10 MED ORDER — PHENYLEPHRINE HCL (PRESSORS) 10 MG/ML IV SOLN
INTRAVENOUS | Status: DC | PRN
  Administered 2022-05-10 (×2): 80 ug via INTRAVENOUS

## 2022-05-10 MED ORDER — ACETAMINOPHEN 500 MG PO TABS
1000.0000 mg | ORAL_TABLET | Freq: Four times a day (QID) | ORAL | Status: DC
  Administered 2022-05-10 – 2022-05-11 (×4): 1000 mg via ORAL
  Filled 2022-05-10 (×4): qty 2

## 2022-05-10 MED ORDER — ONDANSETRON HCL 4 MG/2ML IJ SOLN
INTRAMUSCULAR | Status: AC
  Filled 2022-05-10: qty 2

## 2022-05-10 MED ORDER — ACETAMINOPHEN 500 MG PO TABS
ORAL_TABLET | ORAL | Status: AC
  Filled 2022-05-10: qty 2

## 2022-05-10 MED ORDER — SCOPOLAMINE 1 MG/3DAYS TD PT72
1.0000 | MEDICATED_PATCH | TRANSDERMAL | Status: DC
  Administered 2022-05-10: 1.5 mg via TRANSDERMAL

## 2022-05-10 MED ORDER — KETOROLAC TROMETHAMINE 15 MG/ML IJ SOLN: 15.0000 mg | INTRAMUSCULAR | Status: DC

## 2022-05-10 MED ORDER — ORAL CARE MOUTH RINSE: 15.0000 mL | Freq: Once | OROMUCOSAL | Status: AC

## 2022-05-10 MED ORDER — LIDOCAINE HCL 2 % IJ SOLN
INTRAMUSCULAR | Status: AC
  Filled 2022-05-10: qty 20

## 2022-05-10 MED ORDER — BUPIVACAINE LIPOSOME 1.3 % IJ SUSP: 20.0000 mL | Freq: Once | INTRAMUSCULAR | Status: DC

## 2022-05-10 MED ORDER — LACTATED RINGERS IR SOLN
Status: DC | PRN
  Administered 2022-05-10: 1000 mL

## 2022-05-10 MED ORDER — ENSURE PRE-SURGERY PO LIQD
296.0000 mL | Freq: Once | ORAL | Status: DC
  Filled 2022-05-10: qty 296

## 2022-05-10 MED ORDER — MOMETASONE FURO-FORMOTEROL FUM 200-5 MCG/ACT IN AERO: 2.0000 | INHALATION_SPRAY | Freq: Two times a day (BID) | RESPIRATORY_TRACT | Status: DC

## 2022-05-10 MED ORDER — HYDROMORPHONE HCL 1 MG/ML IJ SOLN
0.5000 mg | INTRAMUSCULAR | Status: DC | PRN
  Administered 2022-05-10 (×2): 0.5 mg via INTRAVENOUS
  Filled 2022-05-10 (×2): qty 0.5

## 2022-05-10 MED ORDER — LIDOCAINE HCL 2 % IJ SOLN
INTRAMUSCULAR | Status: AC
Start: 2022-05-10 — End: ?
  Filled 2022-05-10: qty 20

## 2022-05-10 MED ORDER — CEFAZOLIN SODIUM-DEXTROSE 2-4 GM/100ML-% IV SOLN
INTRAVENOUS | Status: AC
  Filled 2022-05-10: qty 100

## 2022-05-10 MED ORDER — FENTANYL CITRATE (PF) 250 MCG/5ML IJ SOLN
INTRAMUSCULAR | Status: DC | PRN
  Administered 2022-05-10 (×2): 50 ug via INTRAVENOUS
  Administered 2022-05-10: 100 ug via INTRAVENOUS
  Administered 2022-05-10 (×3): 50 ug via INTRAVENOUS

## 2022-05-10 MED ORDER — PHENYLEPHRINE HCL-NACL 20-0.9 MG/250ML-% IV SOLN
INTRAVENOUS | Status: DC | PRN
  Administered 2022-05-10: 50 ug/min via INTRAVENOUS

## 2022-05-10 MED ORDER — LIDOCAINE 2% (20 MG/ML) 5 ML SYRINGE
INTRAMUSCULAR | Status: DC | PRN
  Administered 2022-05-10: 80 mg via INTRAVENOUS
  Administered 2022-05-10: 1.5 mg/kg/h via INTRAVENOUS

## 2022-05-10 MED ORDER — PANTOPRAZOLE SODIUM 40 MG PO TBEC
40.0000 mg | DELAYED_RELEASE_TABLET | Freq: Two times a day (BID) | ORAL | Status: DC
  Administered 2022-05-10 – 2022-05-11 (×2): 40 mg via ORAL
  Filled 2022-05-10 (×2): qty 1

## 2022-05-10 MED ORDER — CHLORHEXIDINE GLUCONATE 0.12 % MT SOLN
15.0000 mL | Freq: Once | OROMUCOSAL | Status: AC
  Administered 2022-05-10: 15 mL via OROMUCOSAL

## 2022-05-10 MED ORDER — BUPIVACAINE LIPOSOME 1.3 % IJ SUSP
INTRAMUSCULAR | Status: AC
Start: 2022-05-10 — End: ?
  Filled 2022-05-10: qty 20

## 2022-05-10 MED ORDER — LEVOCETIRIZINE DIHYDROCHLORIDE 5 MG PO TABS: 5.0000 mg | ORAL_TABLET | Freq: Every evening | ORAL | Status: DC

## 2022-05-10 MED ORDER — BUSPIRONE HCL 5 MG PO TABS
10.0000 mg | ORAL_TABLET | Freq: Every evening | ORAL | Status: DC
  Administered 2022-05-10: 10 mg via ORAL
  Filled 2022-05-10: qty 2

## 2022-05-10 MED ORDER — ALBUTEROL SULFATE (2.5 MG/3ML) 0.083% IN NEBU
INHALATION_SOLUTION | RESPIRATORY_TRACT | Status: AC
  Filled 2022-05-10: qty 3

## 2022-05-10 MED ORDER — METOPROLOL TARTRATE 5 MG/5ML IV SOLN: 5.0000 mg | Freq: Four times a day (QID) | INTRAVENOUS | Status: DC | PRN

## 2022-05-10 MED ORDER — MIDAZOLAM HCL 2 MG/2ML IJ SOLN
INTRAMUSCULAR | Status: AC
Start: 2022-05-10 — End: ?
  Filled 2022-05-10: qty 2

## 2022-05-10 MED ORDER — CEFAZOLIN SODIUM-DEXTROSE 2-4 GM/100ML-% IV SOLN
2.0000 g | INTRAVENOUS | Status: AC
  Administered 2022-05-10: 2 g via INTRAVENOUS

## 2022-05-10 MED ORDER — HYDROMORPHONE HCL 1 MG/ML IJ SOLN
INTRAMUSCULAR | Status: AC
  Filled 2022-05-10: qty 1

## 2022-05-10 MED ORDER — LIDOCAINE HCL (PF) 2 % IJ SOLN
INTRAMUSCULAR | Status: AC
  Filled 2022-05-10: qty 5

## 2022-05-10 MED ORDER — DEXTROSE-NACL 5-0.45 % IV SOLN: INTRAVENOUS | Status: DC

## 2022-05-10 MED ORDER — 0.9 % SODIUM CHLORIDE (POUR BTL) OPTIME
TOPICAL | Status: DC | PRN
  Administered 2022-05-10: 1000 mL

## 2022-05-10 MED ORDER — STERILE WATER FOR IRRIGATION IR SOLN
Status: DC | PRN
  Administered 2022-05-10: 500 mL

## 2022-05-10 MED ORDER — KETAMINE HCL 50 MG/5ML IJ SOSY
PREFILLED_SYRINGE | INTRAMUSCULAR | Status: AC
  Filled 2022-05-10: qty 5

## 2022-05-10 SURGICAL SUPPLY — 60 items
APPLIER CLIP 5 13 M/L LIGAMAX5 (MISCELLANEOUS)
APPLIER CLIP ROT 10 11.4 M/L (STAPLE)
BAG COUNTER SPONGE SURGICOUNT (BAG) ×2 IMPLANT
BLADE SURG SZ11 CARB STEEL (BLADE) ×2 IMPLANT
CHLORAPREP W/TINT 26 (MISCELLANEOUS) ×2 IMPLANT
CLIP APPLIE 5 13 M/L LIGAMAX5 (MISCELLANEOUS) IMPLANT
CLIP APPLIE ROT 10 11.4 M/L (STAPLE) IMPLANT
COVER SURGICAL LIGHT HANDLE (MISCELLANEOUS) ×2 IMPLANT
COVER TIP SHEARS 8 DVNC (MISCELLANEOUS) IMPLANT
COVER TIP SHEARS 8MM DA VINCI (MISCELLANEOUS)
DERMABOND ADVANCED (GAUZE/BANDAGES/DRESSINGS) ×1
DERMABOND ADVANCED .7 DNX12 (GAUZE/BANDAGES/DRESSINGS) ×1 IMPLANT
DRAIN PENROSE 0.5X18 (DRAIN) IMPLANT
DRAPE ARM DVNC X/XI (DISPOSABLE) ×4 IMPLANT
DRAPE COLUMN DVNC XI (DISPOSABLE) ×1 IMPLANT
DRAPE CV SPLIT W-CLR ANES SCRN (DRAPES) IMPLANT
DRAPE DA VINCI XI ARM (DISPOSABLE) ×4
DRAPE DA VINCI XI COLUMN (DISPOSABLE) ×1
DRAPE ORTHO SPLIT 77X108 STRL (DRAPES) ×1
DRAPE SURG ORHT 6 SPLT 77X108 (DRAPES) ×1 IMPLANT
ELECT REM PT RETURN 15FT ADLT (MISCELLANEOUS) ×2 IMPLANT
GAUZE 4X4 16PLY ~~LOC~~+RFID DBL (SPONGE) ×2 IMPLANT
GLOVE BIOGEL PI IND STRL 7.0 (GLOVE) ×2 IMPLANT
GLOVE BIOGEL PI INDICATOR 7.0 (GLOVE) ×2
GLOVE SURG SS PI 7.0 STRL IVOR (GLOVE) ×4 IMPLANT
GOWN STRL REUS W/ TWL LRG LVL3 (GOWN DISPOSABLE) ×2 IMPLANT
GOWN STRL REUS W/ TWL XL LVL3 (GOWN DISPOSABLE) IMPLANT
GOWN STRL REUS W/TWL LRG LVL3 (GOWN DISPOSABLE) ×2
GOWN STRL REUS W/TWL XL LVL3 (GOWN DISPOSABLE)
IRRIG SUCT STRYKERFLOW 2 WTIP (MISCELLANEOUS) ×2
IRRIGATION SUCT STRKRFLW 2 WTP (MISCELLANEOUS) ×1 IMPLANT
KIT BASIN OR (CUSTOM PROCEDURE TRAY) ×2 IMPLANT
KIT TURNOVER KIT A (KITS) IMPLANT
LUBRICANT JELLY K Y 4OZ (MISCELLANEOUS) IMPLANT
MARKER SKIN DUAL TIP RULER LAB (MISCELLANEOUS) ×2 IMPLANT
MAT PREVALON FULL STRYKER (MISCELLANEOUS) ×1 IMPLANT
NEEDLE HYPO 22GX1.5 SAFETY (NEEDLE) ×2 IMPLANT
PACK CARDIOVASCULAR III (CUSTOM PROCEDURE TRAY) ×2 IMPLANT
SCISSORS LAP 5X35 DISP (ENDOMECHANICALS) IMPLANT
SEAL CANN UNIV 5-8 DVNC XI (MISCELLANEOUS) ×4 IMPLANT
SEAL XI 5MM-8MM UNIVERSAL (MISCELLANEOUS) ×4
SEALER VESSEL DA VINCI XI (MISCELLANEOUS) ×1
SEALER VESSEL EXT DVNC XI (MISCELLANEOUS) ×1 IMPLANT
SOL ANTI FOG 6CC (MISCELLANEOUS) ×1 IMPLANT
SOLUTION ANTI FOG 6CC (MISCELLANEOUS) ×1
SOLUTION ELECTROLUBE (MISCELLANEOUS) IMPLANT
SPIKE FLUID TRANSFER (MISCELLANEOUS) ×2 IMPLANT
SUT ETHIBOND 0 36 GRN (SUTURE) ×4 IMPLANT
SUT MNCRL AB 4-0 PS2 18 (SUTURE) ×2 IMPLANT
SUT SILK 0 SH 30 (SUTURE) IMPLANT
SUT SILK 2 0 SH (SUTURE) ×2 IMPLANT
SYR 20ML LL LF (SYRINGE) ×4 IMPLANT
TIP INNERVISION DETACH 40FR (MISCELLANEOUS) IMPLANT
TIP INNERVISION DETACH 50FR (MISCELLANEOUS) IMPLANT
TIP INNERVISION DETACH 56FR (MISCELLANEOUS) IMPLANT
TIPS INNERVISION DETACH 40FR (MISCELLANEOUS)
TOWEL OR 17X26 10 PK STRL BLUE (TOWEL DISPOSABLE) ×2 IMPLANT
TRAY FOLEY MTR SLVR 16FR STAT (SET/KITS/TRAYS/PACK) IMPLANT
TROCAR Z-THREAD OPTICAL 5X100M (TROCAR) ×2 IMPLANT
TUBING INSUFFLATION 10FT LAP (TUBING) ×2 IMPLANT

## 2022-05-10 NOTE — Op Note (Signed)
Preoperative diagnosis: hiatal hernia  Postoperative diagnosis: same   Procedure: robotic paraesophageal hernia repair, robotic Toupet fundoplication  Surgeon: Gurney Maxin, M.D.  Asst: Kaylyn Lim, M.D.  Anesthesia: general  Indications for procedure: Ann Porter is a 46 y.o. year old female with symptoms of reflux not responsive to medications. She presents for repair.  Description of procedure: The patient was brought into the operative suite. Anesthesia was administered with General endotracheal anesthesia. WHO checklist was applied. The patient was then placed in supine position. The area was prepped and draped in the usual sterile fashion.  Next, a left mid abdominal incision was made. A 64m trocar was used to gain access to the peritoneal cavity by optical entry technique. Pneumoperitoneum was applied with a high flow and low pressure. The laparoscope was reinserted to confirm position. An 8 mm trocar was placed in the left mid abdomen and an 8 mm trocar was placed in the left lateral abdomen. An 8 mm trocar was placed in the right mid abdomen. A 5 mm trocar was placed in the right upper abdomen. Bilateral TAP blocks were placed with Marcaine/Exparel mix.  A Nathanson retractor was placed in the subxiphoid space and used to retract the left lobe of the liver.  The hiatal hernia appeared small to moderate size and contained the cardia of the stomach. The pars flaccida was divided with harmonic scalpel The peritoneum of the right crus was divided and the sac separated from the chest contents. This plane was continued anteriorly and to the left crus. Next, the posterior area was dissected free. Additional care was used to dissect attachments to the chest to the sac and esophagus to improve mobility. A penrose was placed around the GE junction for visualization and retraction. The esophagus was completely freed from surrounding attachments. Care was taken to avoid injury to the vagus  nerves. The sac was divided from the GE junction and removed.  The crus was repaired with 4 interrupted 2-0 ethibond sutures showing appropriate sizing of the crus. A toupet was created by bringing the fundus posterior to the esophagus and 3 2-0 silks were used to suture the esophagus to the fundus on the right side. A portion of anterior fundus was then sutured to the esophagus on the left side with 3 2-0 silks. The patient was moderately hypoxic at this time. Initially, the plan was for endoscopy, but decision was made to forego the endoscopy and get imaging in the morning to allow her physiology to return to normal and optimize her respiratory system.  Hemostasis was inspected and intact. Pneumoperitoneum was removed. All trocars were removed. All incisions were closed with 4-0 monocryl subcuticular suture. Dermabond was placed for dressing. The patient awoke from anesthesia and was brought to pacu in stable condition.   Findings: small to moderate hiatal hernia  Specimen: none  Implant: none   Blood loss: 20 ml  Local anesthesia: 50 ml Exparel:Marcaine Mix  Complications: none  LGurney Maxin M.D. General, Bariatric, & Minimally Invasive Surgery [facility]

## 2022-05-10 NOTE — Anesthesia Procedure Notes (Signed)
Procedure Name: Intubation Date/Time: 05/10/2022 11:45 AM  Performed by: Sharlette Dense, CRNAPre-anesthesia Checklist: Patient identified, Emergency Drugs available, Suction available and Patient being monitored Patient Re-evaluated:Patient Re-evaluated prior to induction Oxygen Delivery Method: Circle system utilized Preoxygenation: Pre-oxygenation with 100% oxygen Induction Type: IV induction Ventilation: Mask ventilation without difficulty Laryngoscope Size: Mac and 3 Grade View: Grade I Tube type: Oral Tube size: 7.5 mm Number of attempts: 1 Airway Equipment and Method: Stylet Placement Confirmation: ETT inserted through vocal cords under direct vision, positive ETCO2 and breath sounds checked- equal and bilateral Secured at: 22 cm Tube secured with: Tape Dental Injury: Teeth and Oropharynx as per pre-operative assessment  Comments: Intubation performed by Caryl Pina, EMT, Paramedic student

## 2022-05-10 NOTE — Anesthesia Postprocedure Evaluation (Signed)
Anesthesia Post Note  Patient: Ann Porter  Procedure(s) Performed: XI ROBOTIC ASSISTED HIATAL HERNIA REPAIR WITH PARTIAL FUNDOPLICATION     Patient location during evaluation: PACU Anesthesia Type: General Level of consciousness: awake Pain management: pain level controlled Vital Signs Assessment: post-procedure vital signs reviewed and stable Respiratory status: spontaneous breathing Cardiovascular status: stable Postop Assessment: no apparent nausea or vomiting Anesthetic complications: no   No notable events documented.  Last Vitals:  Vitals:   05/10/22 1630 05/10/22 1700  BP:  (!) 158/98  Pulse: 100 (!) 110  Resp: 12 18  Temp:  36.7 C  SpO2: 98% 94%    Last Pain:  Vitals:   05/10/22 1700  TempSrc: Oral  PainSc:                  Devesh Monforte

## 2022-05-10 NOTE — H&P (Signed)
Chief Complaint: Hernia  History of Present Illness: Ann Porter is a 46 y.o. female who is seen today for hiatal hernia.  She continues to have worsening reflux despite PPI and H2 blocker and now adding buspar. She has regurgitation at times and nausea.  She does not feel ready to proceed with any bariatric surgery due to recent loss and overall high stress levels.  Review of Systems: A complete review of systems was obtained from the patient. I have reviewed this information and discussed as appropriate with the patient. See HPI as well for other ROS.  Review of Systems  Constitutional: Negative.  HENT: Negative.  Eyes: Negative.  Respiratory: Negative.  Cardiovascular: Negative.  Gastrointestinal: Negative.  Genitourinary: Negative.  Musculoskeletal: Negative.  Skin: Negative.  Neurological: Negative.  Endo/Heme/Allergies: Negative.  Psychiatric/Behavioral: Negative.    Medical History: Past Medical History:  Diagnosis Date   Gastroparesis   GERD (gastroesophageal reflux disease)   Hypertension   IIH (idiopathic intracranial hypertension)   Patient Active Problem List  Diagnosis   IIH (idiopathic intracranial hypertension)   Past Surgical History:  Procedure Laterality Date   CESAREAN SECTION 09/2002   REPEAT CESAREAN SECTION 01/2008   HYSTERECTOMY   TONSILLECTOMY    Allergies  Allergen Reactions   Sulfa (Sulfonamide Antibiotics) Hives   Current Outpatient Medications on File Prior to Visit  Medication Sig Dispense Refill   acetaZOLAMIDE (DIAMOX) 500 mg SR capsule Take 500 mg by mouth 2 (two) times daily.   escitalopram oxalate (LEXAPRO) 10 MG tablet Take 10 mg by mouth once daily.   estradiol (ESTRACE) 0.5 MG tablet Take 1 tablet by mouth once daily.   pantoprazole (PROTONIX) 40 MG DR tablet Take 1 tablet by mouth once daily.   zonisamide (ZONEGRAN) 100 MG capsule Take 300 mg by mouth once daily. .   No current facility-administered medications on  file prior to visit.   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   Objective:   Vitals:  03/11/22 1415  BP: 128/76  Pulse: 104  Temp: 36.9 C (98.4 F)  SpO2: 98%  Weight: (!) 112.2 kg (247 lb 6.4 oz)  Height: 162.6 cm ('5\' 4"'$ )   Body mass index is 42.47 kg/m.  Physical Exam Constitutional:  Appearance: Normal appearance.  HENT:  Head: Normocephalic and atraumatic.  Pulmonary:  Effort: Pulmonary effort is normal.  Musculoskeletal:  General: Normal range of motion.  Cervical back: Normal range of motion.  Neurological:  General: No focal deficit present.  Mental Status: She is alert and oriented to person, place, and time. Mental status is at baseline.  Psychiatric:  Mood and Affect: Mood normal.  Behavior: Behavior normal.  Thought Content: Thought content normal.   Labs, Imaging and Diagnostic Testing:  I reviewed notes from [place] and Plan:  Diagnoses and all orders for this visit:  Hiatal hernia with gastroesophageal reflux - X-ray upper GI  Given that she is not ready for bariatric surgery and her BMI is not excessively high. I think proceeding with [place] repair with partial fundoplication is a good plan. We discussed about above average risk of recurrence related to obesity.  We discussed the etiology and  symptoms of hiatal hernias. We discussed possible future issues of worsened reflux, early satiety, Cameron's ulcer, and volvulus. We went over surgical options of laparoscopic reduction of the hiatal hernia with identification of the esophagus and stomach, movable of the sac, ensuring appropriate esophageal length into the abdomen, repairing the diaphragm crura, possible placement of mesh, partial fundoplication and other fundoplication options, and possible need for gastrostomy tube. We discussed risks of bleeding, infection, pneumonia, injury to esophagus, injury to stomach, injury to other abdominal or thoracic organs, trouble swallowing, gas bloat, and need for additional surgery. All questions were answered. Patient decided proceed with minimally invasive hiatal hernia repair with partial fundoplication.  No follow-ups on file.

## 2022-05-10 NOTE — Transfer of Care (Signed)
Immediate Anesthesia Transfer of Care Note  Patient: Ann Porter  Procedure(s) Performed: XI ROBOTIC ASSISTED HIATAL HERNIA REPAIR WITH PARTIAL FUNDOPLICATION  Patient Location: PACU  Anesthesia Type:General  Level of Consciousness: drowsy  Airway & Oxygen Therapy: Patient Spontanous Breathing and Patient connected to face mask oxygen  Post-op Assessment: Report given to RN and Post -op Vital signs reviewed and stable  Post vital signs: Reviewed and stable  Last Vitals:  Vitals Value Taken Time  BP 130/77 05/10/22 1356  Temp    Pulse 107 05/10/22 1357  Resp 16 05/10/22 1357  SpO2 94 % 05/10/22 1357  Vitals shown include unvalidated device data.  Last Pain:  Vitals:   05/10/22 0839  TempSrc:   PainSc: 4       Patients Stated Pain Goal: 0 (28/31/51 7616)  Complications: No notable events documented.

## 2022-05-10 NOTE — Anesthesia Preprocedure Evaluation (Addendum)
Anesthesia Evaluation  Patient identified by MRN, date of birth, ID band Patient awake    Reviewed: Allergy & Precautions, NPO status , Patient's Chart, lab work & pertinent test results  Airway Mallampati: II  TM Distance: >3 FB     Dental   Pulmonary neg pulmonary ROS,    breath sounds clear to auscultation       Cardiovascular hypertension,  Rhythm:Regular Rate:Normal     Neuro/Psych  Headaches,    GI/Hepatic Neg liver ROS, hiatal hernia, GERD  ,  Endo/Other  diabetes  Renal/GU Renal disease     Musculoskeletal   Abdominal   Peds  Hematology   Anesthesia Other Findings   Reproductive/Obstetrics                             Anesthesia Physical Anesthesia Plan  ASA: 3  Anesthesia Plan: General   Post-op Pain Management:    Induction: Intravenous  PONV Risk Score and Plan: Ondansetron, Dexamethasone and Midazolam  Airway Management Planned: Oral ETT  Additional Equipment:   Intra-op Plan:   Post-operative Plan: Possible Post-op intubation/ventilation  Informed Consent: I have reviewed the patients History and Physical, chart, labs and discussed the procedure including the risks, benefits and alternatives for the proposed anesthesia with the patient or authorized representative who has indicated his/her understanding and acceptance.     Dental advisory given  Plan Discussed with: CRNA and Anesthesiologist  Anesthesia Plan Comments:         Anesthesia Quick Evaluation

## 2022-05-11 ENCOUNTER — Encounter (HOSPITAL_COMMUNITY): Payer: Self-pay | Admitting: General Surgery

## 2022-05-11 ENCOUNTER — Observation Stay (HOSPITAL_COMMUNITY): Payer: BC Managed Care – PPO

## 2022-05-11 DIAGNOSIS — Z79899 Other long term (current) drug therapy: Secondary | ICD-10-CM | POA: Diagnosis not present

## 2022-05-11 DIAGNOSIS — K449 Diaphragmatic hernia without obstruction or gangrene: Secondary | ICD-10-CM | POA: Diagnosis not present

## 2022-05-11 DIAGNOSIS — I10 Essential (primary) hypertension: Secondary | ICD-10-CM | POA: Diagnosis not present

## 2022-05-11 DIAGNOSIS — K219 Gastro-esophageal reflux disease without esophagitis: Secondary | ICD-10-CM | POA: Diagnosis not present

## 2022-05-11 LAB — BASIC METABOLIC PANEL
Anion gap: 10 (ref 5–15)
BUN: 13 mg/dL (ref 6–20)
CO2: 24 mmol/L (ref 22–32)
Calcium: 9.2 mg/dL (ref 8.9–10.3)
Chloride: 105 mmol/L (ref 98–111)
Creatinine, Ser: 0.86 mg/dL (ref 0.44–1.00)
GFR, Estimated: >60 mL/min (ref >60)
Glucose, Bld: 181 mg/dL — ABNORMAL HIGH (ref 70–99)
Potassium: 4.7 mmol/L (ref 3.5–5.1)
Sodium: 139 mmol/L (ref 135–145)

## 2022-05-11 LAB — CBC
HCT: 43.8 % (ref 36.0–46.0)
Hemoglobin: 15.3 g/dL — ABNORMAL HIGH (ref 12.0–15.0)
MCH: 31.9 pg (ref 26.0–34.0)
MCHC: 34.9 g/dL (ref 30.0–36.0)
MCV: 91.3 fL (ref 80.0–100.0)
Platelets: 254 10*3/uL (ref 150–400)
RBC: 4.8 MIL/uL (ref 3.87–5.11)
RDW: 12 % (ref 11.5–15.5)
WBC: 19.3 10*3/uL — ABNORMAL HIGH (ref 4.0–10.5)
nRBC: 0 % (ref 0.0–0.2)

## 2022-05-11 MED ORDER — IBUPROFEN 800 MG PO TABS: 800.0000 mg | ORAL_TABLET | Freq: Three times a day (TID) | ORAL | 0 refills | Status: AC | PRN

## 2022-05-11 MED ORDER — IOHEXOL 300 MG/ML  SOLN
75.0000 mL | Freq: Once | INTRAMUSCULAR | Status: AC | PRN
  Administered 2022-05-11: 125 mL via ORAL

## 2022-05-11 MED ORDER — OXYCODONE HCL 5 MG PO TABS: 5.0000 mg | ORAL_TABLET | Freq: Four times a day (QID) | ORAL | 0 refills | Status: AC | PRN

## 2022-05-11 NOTE — Progress Notes (Signed)
Patient was given discharge instructions, and all questions were answered.  Patient was stable for discharge and was taken to the main exit by wheelchair. 

## 2022-05-11 NOTE — Progress Notes (Signed)
Transition of Care (TOC) Screening Note  Patient Details  Name: Ann Porter Date of Birth: 13-Apr-1976  Transition of Care [facility]) CM/SW Contact:    Sherie Don, LCSW Phone Number: 05/11/2022, 10:43 AM  Transition of Care Department [facility]) has reviewed patient and no TOC needs have been identified at this time. We will continue to monitor patient advancement through interdisciplinary progression rounds. If new patient transition needs arise, please place a TOC consult.

## 2022-05-26 NOTE — Discharge Summary (Addendum)
Physician Discharge Summary  Consepcion Porter QMV:[id number] DOB: 1976/10/01 DOA: 05/10/2022  PCP: Clinton Quant, MD  Admit date: 05/10/2022 Discharge date: 05/11/2022  Recommendations for Outpatient Follow-up:   (include homehealth, outpatient follow-up instructions, specific recommendations for PCP to follow-up on, etc.)   Discharge Diagnoses:  Principal Problem:   Hiatal hernia   Surgical Procedure: robotic hiatal hernia  Discharge Condition: Good Disposition: Home  Diet recommendation: liquid diet for 2 weeks   Hospital Course:  46 yo female with hiatal hernia underwent repair. Post operatively she was admitted to the surgery floor and started on liquid diet. She did well and was discharged home POD 1.  Discharge Instructions  Discharge Instructions     Call MD for:  difficulty breathing, headache or visual disturbances   Complete by: As directed    Call MD for:  hives   Complete by: As directed    Call MD for:  persistant nausea and vomiting   Complete by: As directed    Call MD for:  redness, tenderness, or signs of infection (pain, swelling, redness, odor or green/yellow discharge around incision site)   Complete by: As directed    Call MD for:  severe uncontrolled pain   Complete by: As directed    Call MD for:  temperature >100.4   Complete by: As directed    Discharge wound care:   Complete by: As directed    Ok to shower tomorrow. Glue will likely peel off in 1-3 weeks. No bandage required   Driving Restrictions   Complete by: As directed    No driving while on narcotics   Increase activity slowly   Complete by: As directed    Lifting restrictions   Complete by: As directed    No lifting greater than 20 pounds for 3 weeks      Allergies as of 05/11/2022       Reactions   Sulfa Antibiotics Hives        Medication List     TAKE these medications    acetaminophen 500 MG tablet Commonly known as: TYLENOL Take 500-1,000 mg by mouth every 6  (six) hours as needed (for pain.).   budesonide-formoterol 160-4.5 MCG/ACT inhaler Commonly known as: SYMBICORT Inhale 2 puffs into the lungs 2 (two) times daily as needed (respiratory issues.).   busPIRone 10 MG tablet Commonly known as: BUSPAR Take 1 tablet (10 mg total) by mouth 2 (two) times daily. What changed: when to take this   ergocalciferol 1.25 MG (50000 UT) capsule Commonly known as: VITAMIN D2 Take 50,000 Units by mouth every Monday.   ezetimibe 10 MG tablet Commonly known as: ZETIA Take 10 mg by mouth every evening.   famotidine 40 MG tablet Commonly known as: PEPCID TAKE 1 TABLET BY MOUTH AT BEDTIME   Flonase Sensimist 27.5 MCG/SPRAY nasal spray Generic drug: fluticasone Place 2 sprays into the nose daily as needed for allergies.   ibuprofen 800 MG tablet Commonly known as: ADVIL Take 1 tablet (800 mg total) by mouth every 8 (eight) hours as needed.   levocetirizine 5 MG tablet Commonly known as: XYZAL Take 5 mg by mouth every evening.   lisinopril 40 MG tablet Commonly known as: ZESTRIL Take 40 mg by mouth every evening.   oxyCODONE 5 MG immediate release tablet Commonly known as: Oxy IR/ROXICODONE Take 1 tablet (5 mg total) by mouth every 6 (six) hours as needed for severe pain.   OZEMPIC (0.25 OR 0.5 MG/DOSE) [place] Inject 0.25 mg  into the skin every Thursday. In the morning   pantoprazole 40 MG tablet Commonly known as: PROTONIX Take 1 tablet (40 mg total) by mouth 2 (two) times daily before a meal. What changed:  how much to take when to take this               Discharge Care Instructions  (From admission, onward)           Start     Ordered   05/11/22 0000  Discharge wound care:       Comments: Ok to shower tomorrow. Glue will likely peel off in 1-3 weeks. No bandage required   05/11/22 1201              The results of significant diagnostics from this hospitalization (including imaging, microbiology, ancillary and  laboratory) are listed below for reference.    Significant Diagnostic Studies: DG UGI W SINGLE CM (SOL OR THIN BA)  Result Date: 05/11/2022 CLINICAL DATA:  46 year old female with history of GERD and hiatal hernia, status post hiatal hernia repair with partial fundoplication on 81/85/6314. Request for water-soluble UGI study to rule out postsurgical leak. EXAM: DG UGI W SINGLE CM TECHNIQUE: Single contrast examination was then performed using 120 mL Omnipaque 300. This exam was performed by Durenda Guthrie, PA-C, and was supervised and interpreted by Suzy Bouchard, MD. FLUOROSCOPY: Radiation Exposure Index (as provided by the fluoroscopic device): 28.1 mGy Kerma Three spot images were taken. COMPARISON:  Double-contrast UGI on 04/26/2022. FINDINGS: Esophagus: The visualized distal esophagus with normal appearance. Contrast flows freely through the GE junction. No obstruction. Esophageal motility:  Not examined. Gastroesophageal reflux:  None visualized. Ingested 56m barium tablet: Not given. Stomach: Normal appearance. No hiatal hernia. No postsurgical leak noted. Gastric emptying: Normal. Duodenum:  Normal appearance. Other:  None. IMPRESSION: 1. Hiatal hernia has been surgically reduced below the diaphragm. GE junction below the diaphragm. 2.  No obstruction at the GE junction. 3.  No esophageal leak. Electronically Signed   By: SSuzy BouchardM.D.   On: 05/11/2022 09:51   DG CHEST PORT 1 VIEW  Result Date: 05/10/2022 CLINICAL DATA:  Difficulty breathing, recent hiatal hernia repair surgery EXAM: PORTABLE CHEST 1 VIEW COMPARISON:  None Available. FINDINGS: Transverse diameter of heart is within normal limits. There is poor inspiration. Linear densities are seen in medial left lower lung field. There is no pleural effusion or pneumothorax. IMPRESSION: Linear densities in medial left lower lung fields suggest subsegmental atelectasis. Electronically Signed   By: PElmer PickerM.D.   On: 05/10/2022  14:37    Labs: Basic Metabolic Panel: No results for input(s): "NA", "K", "CL", "CO2", "GLUCOSE", "BUN", "CREATININE", "CALCIUM", "MG", "PHOS" in the last 168 hours. Liver Function Tests: No results for input(s): "AST", "ALT", "ALKPHOS", "BILITOT", "PROT", "ALBUMIN" in the last 168 hours.  CBC: No results for input(s): "WBC", "NEUTROABS", "HGB", "HCT", "MCV", "PLT" in the last 168 hours.  CBG: No results for input(s): "GLUCAP" in the last 168 hours.  Principal Problem:   Hiatal hernia   Time coordinating discharge: 15 min

## 2022-06-01 DIAGNOSIS — E109 Type 1 diabetes mellitus without complications: Secondary | ICD-10-CM | POA: Diagnosis not present

## 2022-07-02 DIAGNOSIS — E109 Type 1 diabetes mellitus without complications: Secondary | ICD-10-CM | POA: Diagnosis not present

## 2022-07-12 DIAGNOSIS — R7401 Elevation of levels of liver transaminase levels: Secondary | ICD-10-CM | POA: Diagnosis not present

## 2022-08-01 DIAGNOSIS — E109 Type 1 diabetes mellitus without complications: Secondary | ICD-10-CM | POA: Diagnosis not present

## 2022-08-09 DIAGNOSIS — E119 Type 2 diabetes mellitus without complications: Secondary | ICD-10-CM | POA: Diagnosis not present

## 2022-08-09 DIAGNOSIS — G932 Benign intracranial hypertension: Secondary | ICD-10-CM | POA: Diagnosis not present

## 2022-08-09 DIAGNOSIS — H524 Presbyopia: Secondary | ICD-10-CM | POA: Diagnosis not present

## 2022-08-23 DIAGNOSIS — N907 Vulvar cyst: Secondary | ICD-10-CM | POA: Diagnosis not present

## 2022-08-30 DIAGNOSIS — N907 Vulvar cyst: Secondary | ICD-10-CM | POA: Diagnosis not present

## 2022-09-01 DIAGNOSIS — E109 Type 1 diabetes mellitus without complications: Secondary | ICD-10-CM | POA: Diagnosis not present

## 2022-10-01 DIAGNOSIS — E109 Type 1 diabetes mellitus without complications: Secondary | ICD-10-CM | POA: Diagnosis not present

## 2022-11-01 DIAGNOSIS — E109 Type 1 diabetes mellitus without complications: Secondary | ICD-10-CM | POA: Diagnosis not present

## 2022-11-03 DIAGNOSIS — Z2821 Immunization not carried out because of patient refusal: Secondary | ICD-10-CM | POA: Diagnosis not present

## 2022-11-03 DIAGNOSIS — Z01419 Encounter for gynecological examination (general) (routine) without abnormal findings: Secondary | ICD-10-CM | POA: Diagnosis not present

## 2022-11-03 DIAGNOSIS — Z1231 Encounter for screening mammogram for malignant neoplasm of breast: Secondary | ICD-10-CM | POA: Diagnosis not present

## 2022-11-03 DIAGNOSIS — Z719 Counseling, unspecified: Secondary | ICD-10-CM | POA: Diagnosis not present

## 2022-11-03 DIAGNOSIS — N907 Vulvar cyst: Secondary | ICD-10-CM | POA: Diagnosis not present

## 2022-11-08 DIAGNOSIS — E782 Mixed hyperlipidemia: Secondary | ICD-10-CM | POA: Diagnosis not present

## 2022-11-08 DIAGNOSIS — E1165 Type 2 diabetes mellitus with hyperglycemia: Secondary | ICD-10-CM | POA: Diagnosis not present

## 2022-11-08 DIAGNOSIS — I10 Essential (primary) hypertension: Secondary | ICD-10-CM | POA: Diagnosis not present

## 2022-11-08 DIAGNOSIS — Z0000 Encounter for general adult medical examination without abnormal findings: Secondary | ICD-10-CM | POA: Diagnosis not present

## 2022-11-09 DIAGNOSIS — R8281 Pyuria: Secondary | ICD-10-CM | POA: Diagnosis not present

## 2022-11-12 DIAGNOSIS — J3089 Other allergic rhinitis: Secondary | ICD-10-CM | POA: Diagnosis not present

## 2022-11-12 DIAGNOSIS — J45909 Unspecified asthma, uncomplicated: Secondary | ICD-10-CM | POA: Diagnosis not present

## 2022-11-12 DIAGNOSIS — J301 Allergic rhinitis due to pollen: Secondary | ICD-10-CM | POA: Diagnosis not present

## 2022-12-02 DIAGNOSIS — E109 Type 1 diabetes mellitus without complications: Secondary | ICD-10-CM | POA: Diagnosis not present

## 2022-12-31 DIAGNOSIS — E109 Type 1 diabetes mellitus without complications: Secondary | ICD-10-CM | POA: Diagnosis not present

## 2023-01-31 DIAGNOSIS — E109 Type 1 diabetes mellitus without complications: Secondary | ICD-10-CM | POA: Diagnosis not present

## 2023-03-02 DIAGNOSIS — E109 Type 1 diabetes mellitus without complications: Secondary | ICD-10-CM | POA: Diagnosis not present

## 2023-03-16 DIAGNOSIS — L570 Actinic keratosis: Secondary | ICD-10-CM | POA: Diagnosis not present

## 2023-03-16 DIAGNOSIS — L249 Irritant contact dermatitis, unspecified cause: Secondary | ICD-10-CM | POA: Diagnosis not present

## 2023-03-16 DIAGNOSIS — L918 Other hypertrophic disorders of the skin: Secondary | ICD-10-CM | POA: Diagnosis not present

## 2023-03-16 DIAGNOSIS — L814 Other melanin hyperpigmentation: Secondary | ICD-10-CM | POA: Diagnosis not present

## 2023-03-16 DIAGNOSIS — L578 Other skin changes due to chronic exposure to nonionizing radiation: Secondary | ICD-10-CM | POA: Diagnosis not present

## 2023-04-02 DIAGNOSIS — E109 Type 1 diabetes mellitus without complications: Secondary | ICD-10-CM | POA: Diagnosis not present

## 2023-05-02 DIAGNOSIS — E109 Type 1 diabetes mellitus without complications: Secondary | ICD-10-CM | POA: Diagnosis not present

## 2023-05-26 DIAGNOSIS — Z0000 Encounter for general adult medical examination without abnormal findings: Secondary | ICD-10-CM | POA: Diagnosis not present

## 2023-05-26 DIAGNOSIS — Z719 Counseling, unspecified: Secondary | ICD-10-CM | POA: Diagnosis not present

## 2023-05-26 DIAGNOSIS — E669 Obesity, unspecified: Secondary | ICD-10-CM | POA: Diagnosis not present

## 2023-05-26 DIAGNOSIS — Z2821 Immunization not carried out because of patient refusal: Secondary | ICD-10-CM | POA: Diagnosis not present

## 2023-06-02 DIAGNOSIS — E109 Type 1 diabetes mellitus without complications: Secondary | ICD-10-CM | POA: Diagnosis not present

## 2023-06-08 DIAGNOSIS — L02422 Furuncle of left axilla: Secondary | ICD-10-CM | POA: Diagnosis not present

## 2023-06-08 DIAGNOSIS — L853 Xerosis cutis: Secondary | ICD-10-CM | POA: Diagnosis not present

## 2023-06-08 DIAGNOSIS — L02421 Furuncle of right axilla: Secondary | ICD-10-CM | POA: Diagnosis not present

## 2023-06-08 DIAGNOSIS — L298 Other pruritus: Secondary | ICD-10-CM | POA: Diagnosis not present

## 2023-06-15 DIAGNOSIS — J029 Acute pharyngitis, unspecified: Secondary | ICD-10-CM | POA: Diagnosis not present

## 2023-07-03 DIAGNOSIS — E109 Type 1 diabetes mellitus without complications: Secondary | ICD-10-CM | POA: Diagnosis not present

## 2023-07-25 DIAGNOSIS — R300 Dysuria: Secondary | ICD-10-CM | POA: Diagnosis not present

## 2023-08-02 DIAGNOSIS — E109 Type 1 diabetes mellitus without complications: Secondary | ICD-10-CM | POA: Diagnosis not present

## 2023-09-02 DIAGNOSIS — E109 Type 1 diabetes mellitus without complications: Secondary | ICD-10-CM | POA: Diagnosis not present

## 2023-09-12 ENCOUNTER — Other Ambulatory Visit: Payer: Self-pay | Admitting: Physician Assistant

## 2023-09-20 DIAGNOSIS — Z719 Counseling, unspecified: Secondary | ICD-10-CM | POA: Diagnosis not present

## 2023-09-20 DIAGNOSIS — E559 Vitamin D deficiency, unspecified: Secondary | ICD-10-CM | POA: Diagnosis not present

## 2023-09-20 DIAGNOSIS — E119 Type 2 diabetes mellitus without complications: Secondary | ICD-10-CM | POA: Diagnosis not present

## 2023-09-20 DIAGNOSIS — Z79899 Other long term (current) drug therapy: Secondary | ICD-10-CM | POA: Diagnosis not present

## 2023-10-02 DIAGNOSIS — E109 Type 1 diabetes mellitus without complications: Secondary | ICD-10-CM | POA: Diagnosis not present

## 2023-11-02 DIAGNOSIS — E109 Type 1 diabetes mellitus without complications: Secondary | ICD-10-CM | POA: Diagnosis not present

## 2023-11-17 DIAGNOSIS — Z2821 Immunization not carried out because of patient refusal: Secondary | ICD-10-CM | POA: Diagnosis not present

## 2023-11-17 DIAGNOSIS — Z719 Counseling, unspecified: Secondary | ICD-10-CM | POA: Diagnosis not present

## 2023-11-17 DIAGNOSIS — N9089 Other specified noninflammatory disorders of vulva and perineum: Secondary | ICD-10-CM | POA: Diagnosis not present

## 2023-11-17 DIAGNOSIS — Z01419 Encounter for gynecological examination (general) (routine) without abnormal findings: Secondary | ICD-10-CM | POA: Diagnosis not present

## 2023-11-28 DIAGNOSIS — Z1231 Encounter for screening mammogram for malignant neoplasm of breast: Secondary | ICD-10-CM | POA: Diagnosis not present

## 2023-12-03 DIAGNOSIS — E109 Type 1 diabetes mellitus without complications: Secondary | ICD-10-CM | POA: Diagnosis not present

## 2023-12-31 DIAGNOSIS — E109 Type 1 diabetes mellitus without complications: Secondary | ICD-10-CM | POA: Diagnosis not present

## 2024-01-06 DIAGNOSIS — J301 Allergic rhinitis due to pollen: Secondary | ICD-10-CM | POA: Diagnosis not present

## 2024-01-06 DIAGNOSIS — J3089 Other allergic rhinitis: Secondary | ICD-10-CM | POA: Diagnosis not present

## 2024-01-12 DIAGNOSIS — E119 Type 2 diabetes mellitus without complications: Secondary | ICD-10-CM | POA: Diagnosis not present

## 2024-01-12 DIAGNOSIS — G932 Benign intracranial hypertension: Secondary | ICD-10-CM | POA: Diagnosis not present

## 2024-01-12 DIAGNOSIS — H524 Presbyopia: Secondary | ICD-10-CM | POA: Diagnosis not present

## 2024-01-31 DIAGNOSIS — E109 Type 1 diabetes mellitus without complications: Secondary | ICD-10-CM | POA: Diagnosis not present

## 2024-03-01 DIAGNOSIS — E109 Type 1 diabetes mellitus without complications: Secondary | ICD-10-CM | POA: Diagnosis not present

## 2024-03-12 DIAGNOSIS — E559 Vitamin D deficiency, unspecified: Secondary | ICD-10-CM | POA: Diagnosis not present

## 2024-03-12 DIAGNOSIS — I10 Essential (primary) hypertension: Secondary | ICD-10-CM | POA: Diagnosis not present

## 2024-03-12 DIAGNOSIS — E119 Type 2 diabetes mellitus without complications: Secondary | ICD-10-CM | POA: Diagnosis not present

## 2024-03-12 DIAGNOSIS — R748 Abnormal levels of other serum enzymes: Secondary | ICD-10-CM | POA: Diagnosis not present

## 2024-03-12 DIAGNOSIS — E785 Hyperlipidemia, unspecified: Secondary | ICD-10-CM | POA: Diagnosis not present

## 2024-03-19 DIAGNOSIS — L853 Xerosis cutis: Secondary | ICD-10-CM | POA: Diagnosis not present

## 2024-03-19 DIAGNOSIS — L565 Disseminated superficial actinic porokeratosis (DSAP): Secondary | ICD-10-CM | POA: Diagnosis not present

## 2024-03-19 DIAGNOSIS — L718 Other rosacea: Secondary | ICD-10-CM | POA: Diagnosis not present

## 2024-03-19 DIAGNOSIS — L578 Other skin changes due to chronic exposure to nonionizing radiation: Secondary | ICD-10-CM | POA: Diagnosis not present

## 2024-03-19 DIAGNOSIS — L2989 Other pruritus: Secondary | ICD-10-CM | POA: Diagnosis not present

## 2024-03-22 DIAGNOSIS — E785 Hyperlipidemia, unspecified: Secondary | ICD-10-CM | POA: Diagnosis not present

## 2024-03-22 DIAGNOSIS — I10 Essential (primary) hypertension: Secondary | ICD-10-CM | POA: Diagnosis not present

## 2024-03-22 DIAGNOSIS — E781 Pure hyperglyceridemia: Secondary | ICD-10-CM | POA: Diagnosis not present

## 2024-03-22 DIAGNOSIS — E119 Type 2 diabetes mellitus without complications: Secondary | ICD-10-CM | POA: Diagnosis not present

## 2024-04-01 DIAGNOSIS — E109 Type 1 diabetes mellitus without complications: Secondary | ICD-10-CM | POA: Diagnosis not present

## 2024-05-01 DIAGNOSIS — E109 Type 1 diabetes mellitus without complications: Secondary | ICD-10-CM | POA: Diagnosis not present

## 2024-06-01 DIAGNOSIS — E109 Type 1 diabetes mellitus without complications: Secondary | ICD-10-CM | POA: Diagnosis not present

## 2024-07-02 DIAGNOSIS — E109 Type 1 diabetes mellitus without complications: Secondary | ICD-10-CM | POA: Diagnosis not present

## 2024-08-01 DIAGNOSIS — E109 Type 1 diabetes mellitus without complications: Secondary | ICD-10-CM | POA: Diagnosis not present

## 2024-09-01 DIAGNOSIS — E109 Type 1 diabetes mellitus without complications: Secondary | ICD-10-CM | POA: Diagnosis not present

## 2024-09-10 DIAGNOSIS — E785 Hyperlipidemia, unspecified: Secondary | ICD-10-CM | POA: Diagnosis not present

## 2024-09-10 DIAGNOSIS — E559 Vitamin D deficiency, unspecified: Secondary | ICD-10-CM | POA: Diagnosis not present

## 2024-09-17 DIAGNOSIS — E119 Type 2 diabetes mellitus without complications: Secondary | ICD-10-CM | POA: Diagnosis not present

## 2024-09-17 DIAGNOSIS — E781 Pure hyperglyceridemia: Secondary | ICD-10-CM | POA: Diagnosis not present

## 2024-09-17 DIAGNOSIS — E559 Vitamin D deficiency, unspecified: Secondary | ICD-10-CM | POA: Diagnosis not present

## 2024-09-17 DIAGNOSIS — Z719 Counseling, unspecified: Secondary | ICD-10-CM | POA: Diagnosis not present

## 2024-09-19 DIAGNOSIS — L570 Actinic keratosis: Secondary | ICD-10-CM | POA: Diagnosis not present

## 2024-09-19 DIAGNOSIS — L2989 Other pruritus: Secondary | ICD-10-CM | POA: Diagnosis not present
